# Patient Record
Sex: Female | Born: 1985 | Hispanic: Yes | Marital: Married | Smoking: Never smoker
Health system: Southern US, Community
[De-identification: ages and names within clinical notes are randomized; demographics above are authoritative.]

## PROBLEM LIST (undated history)

## (undated) DIAGNOSIS — E282 Polycystic ovarian syndrome: Secondary | ICD-10-CM

## (undated) DIAGNOSIS — M5030 Other cervical disc degeneration, unspecified cervical region: Secondary | ICD-10-CM

## (undated) HISTORY — DX: Polycystic ovarian syndrome: E28.2

## (undated) HISTORY — PX: NO PAST SURGERIES: SHX2092

## (undated) HISTORY — DX: Other cervical disc degeneration, unspecified cervical region: M50.30

---

## 2015-11-12 DIAGNOSIS — L700 Acne vulgaris: Secondary | ICD-10-CM | POA: Diagnosis not present

## 2015-11-12 DIAGNOSIS — L853 Xerosis cutis: Secondary | ICD-10-CM | POA: Diagnosis not present

## 2015-11-12 DIAGNOSIS — L309 Dermatitis, unspecified: Secondary | ICD-10-CM | POA: Diagnosis not present

## 2015-12-09 DIAGNOSIS — H5213 Myopia, bilateral: Secondary | ICD-10-CM | POA: Diagnosis not present

## 2016-02-11 DIAGNOSIS — L700 Acne vulgaris: Secondary | ICD-10-CM | POA: Diagnosis not present

## 2016-02-11 DIAGNOSIS — L810 Postinflammatory hyperpigmentation: Secondary | ICD-10-CM | POA: Diagnosis not present

## 2016-08-20 ENCOUNTER — Encounter: Payer: Self-pay | Admitting: Obstetrics and Gynecology

## 2016-08-20 ENCOUNTER — Ambulatory Visit (INDEPENDENT_AMBULATORY_CARE_PROVIDER_SITE_OTHER): Payer: 59 | Admitting: Obstetrics and Gynecology

## 2016-08-20 VITALS — BP 103/68 | HR 74 | Ht 62.0 in | Wt 142.2 lb

## 2016-08-20 DIAGNOSIS — Z7689 Persons encountering health services in other specified circumstances: Secondary | ICD-10-CM | POA: Diagnosis not present

## 2016-08-20 DIAGNOSIS — Z01419 Encounter for gynecological examination (general) (routine) without abnormal findings: Secondary | ICD-10-CM | POA: Diagnosis not present

## 2016-08-20 DIAGNOSIS — E663 Overweight: Secondary | ICD-10-CM

## 2016-08-20 NOTE — Progress Notes (Signed)
GYNECOLOGY ANNUAL PHYSICAL EXAM PROGRESS NOTE  Subjective:    Melinda Wells is a 31 y.o. G53P0010 female who presents to establish care, and for an annual exam. The patient has relocated from [place] 2 years ago. Has not seen a provider since then. The patient has no complaints today. The patient is sexually active.  The patient wears seatbelts: yes. The patient participates in regular exercise: yes (3 x weekly). The patient reports that there is not domestic violence in her life.    Gynecologic History Menarche age: 38 Patient's last menstrual period was 08/06/2016. Period Cycle (Days): 28 Period Duration (Days): 4-5 Period Pattern: Regular Menstrual Flow: Light Dysmenorrhea: None  Contraception: OCP (estrogen/progesterone) History of STI's: Chlamydia (2-3 years ago).  Last Pap: 2 years ago (performed in [place]). Results were: normal.  Denies h/o abnormal pap smears.   Obstetric History   G1   P0   T0   P0   A1   L0    SAB0   TAB0   Ectopic0   Multiple0   Live Births0     # Outcome Date GA Lbr Len/2nd Weight Sex Delivery Anes PTL Lv  1 AB 2012              Past Medical History:  Diagnosis Date  . PCOS (polycystic ovarian syndrome) 2010    History reviewed. No pertinent surgical history.  Family History  Problem Relation Age of Onset  . Hyperlipidemia Mother     Social History   Social History  . Marital status: Married    Spouse name: N/A  . Number of children: N/A  . Years of education: N/A   Occupational History  . Not on file.   Social History Main Topics  . Smoking status: Never Smoker  . Smokeless tobacco: Never Used  . Alcohol use Yes     Comment: Occasionally   . Drug use: No  . Sexual activity: Yes    Birth control/ protection: Pill   Other Topics Concern  . Not on file   Social History Narrative  . No narrative on file    No current outpatient prescriptions on file prior to visit.   No current facility-administered medications on  file prior to visit.     No Known Allergies   Review of Systems Constitutional: negative for chills, fatigue, fevers and sweats Eyes: negative for irritation, redness and visual disturbance Ears, nose, mouth, throat, and face: negative for hearing loss, nasal congestion, snoring and tinnitus Respiratory: negative for asthma, cough, sputum Cardiovascular: negative for chest pain, dyspnea, exertional chest pressure/discomfort, irregular heart beat, palpitations and syncope Gastrointestinal: negative for abdominal pain, change in bowel habits, nausea and vomiting Genitourinary: negative for abnormal menstrual periods, genital lesions, sexual problems and vaginal discharge, dysuria and urinary incontinence Integument/breast: negative for breast lump, breast tenderness and nipple discharge Hematologic/lymphatic: negative for bleeding and easy bruising Musculoskeletal:negative for back pain and muscle weakness Neurological: negative for dizziness, headaches, vertigo and weakness Endocrine: negative for diabetic symptoms including polydipsia, polyuria and skin dryness Allergic/Immunologic: negative for hay fever and urticaria        Objective:  Blood pressure 103/68, pulse 74, height 5\' 2"  (1.575 m), weight 142 lb 3.2 oz (64.5 kg), last menstrual period 08/06/2016. Body mass index is 26.01 kg/m.  General Appearance:    Alert, cooperative, no distress, appears stated age, overweight  Head:    Normocephalic, without obvious abnormality, atraumatic  Eyes:    PERRL, conjunctiva/corneas clear, EOM's intact,  both eyes  Ears:    Normal external ear canals, both ears  Nose:   Nares normal, septum midline, mucosa normal, no drainage or sinus tenderness  Throat:   Lips, mucosa, and tongue normal; teeth and gums normal  Neck:   Supple, symmetrical, trachea midline, no adenopathy; thyroid: no enlargement/tenderness/nodules; no carotid bruit or JVD  Back:     Symmetric, no curvature, ROM normal, no CVA  tenderness  Lungs:     Clear to auscultation bilaterally, respirations unlabored  Chest Wall:    No tenderness or deformity   Heart:    Regular rate and rhythm, S1 and S2 normal, no murmur, rub or gallop  Breast Exam:    No tenderness, masses, or nipple abnormality  Abdomen:     Soft, non-tender, bowel sounds active all four quadrants, no masses, no organomegaly.    Genitalia:    Pelvic:external genitalia normal, vagina without lesions, discharge, or tenderness, rectovaginal septum  normal. Cervix normal in appearance, no cervical motion tenderness, no adnexal masses or tenderness.  Uterus normal size, shape, mobile, regular contours, nontender.  Rectal:    Normal external sphincter.  No hemorrhoids appreciated. Internal exam not done.   Extremities:   Extremities normal, atraumatic, no cyanosis or edema  Pulses:   2+ and symmetric all extremities  Skin:   Skin color, texture, turgor normal, no rashes or lesions  Lymph nodes:   Cervical, supraclavicular, and axillary nodes normal  Neurologic:   CNII-XII intact, normal strength, sensation and reflexes throughout   .  Labs:  No results found for: WBC, HGB, HCT, MCV, PLT  No results found for: CREATININE, BUN, NA, K, CL, CO2  No results found for: ALT, AST, GGT, ALKPHOS, BILITOT  No results found for: TSH   Assessment:   Encounter for gynecological examination without abnormal finding Encounter to establish care Overweight (BMI 25.0-29.9)  Plan:     Blood tests: CBC with diff and Comprehensive metabolic panel. Breast self exam technique reviewed and patient encouraged to perform self-exam monthly. Contraception: OCP (estrogen/progesterone).  Patient will need a refill in ~ 3-4 months. To call when for refill request closer to time.  Discussed healthy lifestyle modifications. Pap smear up to date. Due in 1 year. Patient has received flu vaccine for this season.   Follow up for annual exam in 1 year.     Hildred LaserAnika Meenakshi Sazama,  MD [facility]

## 2016-08-20 NOTE — Patient Instructions (Addendum)
 Preventive Care 18-39 Years, Female Preventive care refers to lifestyle choices and visits with your health care provider that can promote health and wellness. What does preventive care include?  A yearly physical exam. This is also called an annual well check.  Dental exams once or twice a year.  Routine eye exams. Ask your health care provider how often you should have your eyes checked.  Personal lifestyle choices, including:  Daily care of your teeth and gums.  Regular physical activity.  Eating a healthy diet.  Avoiding tobacco and drug use.  Limiting alcohol use.  Practicing safe sex.  Taking vitamin and mineral supplements as recommended by your health care provider. What happens during an annual well check? The services and screenings done by your health care provider during your annual well check will depend on your age, overall health, lifestyle risk factors, and family history of disease. Counseling  Your health care provider may ask you questions about your:  Alcohol use.  Tobacco use.  Drug use.  Emotional well-being.  Home and relationship well-being.  Sexual activity.  Eating habits.  Work and work environment.  Method of birth control.  Menstrual cycle.  Pregnancy history. Screening  You may have the following tests or measurements:  Height, weight, and BMI.  Diabetes screening. This is done by checking your blood sugar (glucose) after you have not eaten for a while (fasting).  Blood pressure.  Lipid and cholesterol levels. These may be checked every 5 years starting at age 20.  Skin check.  Hepatitis C blood test.  Hepatitis B blood test.  Sexually transmitted disease (STD) testing.  BRCA-related cancer screening. This may be done if you have a family history of breast, ovarian, tubal, or peritoneal cancers.  Pelvic exam and Pap test. This may be done every 3 years starting at age 21. Starting at age 30, this may be done  every 5 years if you have a Pap test in combination with an HPV test. Discuss your test results, treatment options, and if necessary, the need for more tests with your health care provider. Vaccines  Your health care provider may recommend certain vaccines, such as:  Influenza vaccine. This is recommended every year.  Tetanus, diphtheria, and acellular pertussis (Tdap, Td) vaccine. You may need a Td booster every 10 years.  Varicella vaccine. You may need this if you have not been vaccinated.  HPV vaccine. If you are 26 or younger, you may need three doses over 6 months.  Measles, mumps, and rubella (MMR) vaccine. You may need at least one dose of MMR. You may also need a second dose.  Pneumococcal 13-valent conjugate (PCV13) vaccine. You may need this if you have certain conditions and were not previously vaccinated.  Pneumococcal polysaccharide (PPSV23) vaccine. You may need one or two doses if you smoke cigarettes or if you have certain conditions.  Meningococcal vaccine. One dose is recommended if you are age 19-21 years and a first-year college student living in a residence hall, or if you have one of several medical conditions. You may also need additional booster doses.  Hepatitis A vaccine. You may need this if you have certain conditions or if you travel or work in places where you may be exposed to hepatitis A.  Hepatitis B vaccine. You may need this if you have certain conditions or if you travel or work in places where you may be exposed to hepatitis B.  Haemophilus influenzae type b (Hib) vaccine. You may need   this if you have certain risk factors. Talk to your health care provider about which screenings and vaccines you need and how often you need them. This information is not intended to replace advice given to you by your health care provider. Make sure you discuss any questions you have with your health care provider. Document Released: 09/01/2001 Document Revised:  03/25/2016 Document Reviewed: 05/07/2015 Elsevier Interactive Patient Education  2017 Elsevier Inc.  

## 2016-08-21 ENCOUNTER — Telehealth: Payer: Self-pay

## 2016-08-21 LAB — COMPREHENSIVE METABOLIC PANEL
A/G RATIO: 1.5 (ref 1.2–2.2)
ALBUMIN: 4.2 g/dL (ref 3.5–5.5)
ALK PHOS: 65 IU/L (ref 39–117)
ALT: 8 IU/L (ref 0–32)
AST: 12 IU/L (ref 0–40)
BUN / CREAT RATIO: 16 (ref 9–23)
BUN: 10 mg/dL (ref 6–20)
Bilirubin Total: <0.2 mg/dL (ref 0.0–1.2)
CHLORIDE: 99 mmol/L (ref 96–106)
CO2: 24 mmol/L (ref 18–29)
Calcium: 9.4 mg/dL (ref 8.7–10.2)
Creatinine, Ser: 0.64 mg/dL (ref 0.57–1.00)
GFR calc Af Amer: 139 mL/min/{1.73_m2} (ref >59)
GFR calc non Af Amer: 121 mL/min/{1.73_m2} (ref >59)
GLOBULIN, TOTAL: 2.8 g/dL (ref 1.5–4.5)
Glucose: 99 mg/dL (ref 65–99)
POTASSIUM: 4.1 mmol/L (ref 3.5–5.2)
SODIUM: 136 mmol/L (ref 134–144)
Total Protein: 7 g/dL (ref 6.0–8.5)

## 2016-08-21 LAB — CBC
HEMATOCRIT: 40 % (ref 34.0–46.6)
HEMOGLOBIN: 13.1 g/dL (ref 11.1–15.9)
MCH: 27.9 pg (ref 26.6–33.0)
MCHC: 32.8 g/dL (ref 31.5–35.7)
MCV: 85 fL (ref 79–97)
Platelets: 307 10*3/uL (ref 150–379)
RBC: 4.7 x10E6/uL (ref 3.77–5.28)
RDW: 13.3 % (ref 12.3–15.4)
WBC: 10.6 10*3/uL (ref 3.4–10.8)

## 2016-08-21 NOTE — Telephone Encounter (Signed)
Called pt informed her of normal labs. PT gave verbal understanding. Sent text for mychart sign up.

## 2016-08-21 NOTE — Telephone Encounter (Signed)
-----   Message from Hildred LaserAnika Cherry, MD sent at 08/21/2016 11:48 AM EST ----- Please inform of normal annual screening labs

## 2016-09-07 DIAGNOSIS — L700 Acne vulgaris: Secondary | ICD-10-CM | POA: Diagnosis not present

## 2016-09-29 DIAGNOSIS — Z5181 Encounter for therapeutic drug level monitoring: Secondary | ICD-10-CM | POA: Diagnosis not present

## 2016-09-29 DIAGNOSIS — Z3202 Encounter for pregnancy test, result negative: Secondary | ICD-10-CM | POA: Diagnosis not present

## 2016-09-29 DIAGNOSIS — L700 Acne vulgaris: Secondary | ICD-10-CM | POA: Diagnosis not present

## 2016-10-30 DIAGNOSIS — Z3202 Encounter for pregnancy test, result negative: Secondary | ICD-10-CM | POA: Diagnosis not present

## 2016-10-30 DIAGNOSIS — L700 Acne vulgaris: Secondary | ICD-10-CM | POA: Diagnosis not present

## 2016-11-27 DIAGNOSIS — Z5181 Encounter for therapeutic drug level monitoring: Secondary | ICD-10-CM | POA: Diagnosis not present

## 2016-11-27 DIAGNOSIS — L700 Acne vulgaris: Secondary | ICD-10-CM | POA: Diagnosis not present

## 2016-11-30 DIAGNOSIS — L700 Acne vulgaris: Secondary | ICD-10-CM | POA: Diagnosis not present

## 2016-11-30 DIAGNOSIS — Z3202 Encounter for pregnancy test, result negative: Secondary | ICD-10-CM | POA: Diagnosis not present

## 2017-01-04 DIAGNOSIS — Z3202 Encounter for pregnancy test, result negative: Secondary | ICD-10-CM | POA: Diagnosis not present

## 2017-01-04 DIAGNOSIS — L700 Acne vulgaris: Secondary | ICD-10-CM | POA: Diagnosis not present

## 2017-01-12 DIAGNOSIS — G8929 Other chronic pain: Secondary | ICD-10-CM | POA: Diagnosis not present

## 2017-01-12 DIAGNOSIS — R7989 Other specified abnormal findings of blood chemistry: Secondary | ICD-10-CM | POA: Diagnosis not present

## 2017-01-12 DIAGNOSIS — L709 Acne, unspecified: Secondary | ICD-10-CM | POA: Diagnosis not present

## 2017-01-12 DIAGNOSIS — Z7689 Persons encountering health services in other specified circumstances: Secondary | ICD-10-CM | POA: Diagnosis not present

## 2017-01-12 DIAGNOSIS — M545 Low back pain: Secondary | ICD-10-CM | POA: Diagnosis not present

## 2017-01-12 DIAGNOSIS — R5383 Other fatigue: Secondary | ICD-10-CM | POA: Diagnosis not present

## 2017-01-14 DIAGNOSIS — Z3202 Encounter for pregnancy test, result negative: Secondary | ICD-10-CM | POA: Diagnosis not present

## 2017-02-05 ENCOUNTER — Telehealth: Payer: Self-pay | Admitting: Obstetrics and Gynecology

## 2017-02-05 DIAGNOSIS — Z3041 Encounter for surveillance of contraceptive pills: Secondary | ICD-10-CM

## 2017-02-05 MED ORDER — TRI-LINYAH 0.18/0.215/0.25 MG-35 MCG PO TABS: 1.0000 | ORAL_TABLET | Freq: Every day | ORAL | 12 refills | Status: DC

## 2017-02-05 NOTE — Telephone Encounter (Signed)
Called pt no answer. Unable to leave message as no voicemail is set up.  

## 2017-02-05 NOTE — Telephone Encounter (Signed)
Patient called and stated that she needs a refill on the medication (Orthotricyclen) Generic is fine with the patient, Patient stated that Dr. Valentino Saxonherry stated that she would refill the medication even though it was prescribed by another doctor. Patient would like to speak with someone when the medication has been sent in. Please advise.

## 2017-02-15 DIAGNOSIS — L700 Acne vulgaris: Secondary | ICD-10-CM | POA: Diagnosis not present

## 2017-02-15 DIAGNOSIS — Z3202 Encounter for pregnancy test, result negative: Secondary | ICD-10-CM | POA: Diagnosis not present

## 2017-03-18 DIAGNOSIS — L700 Acne vulgaris: Secondary | ICD-10-CM | POA: Diagnosis not present

## 2017-03-18 DIAGNOSIS — Z3202 Encounter for pregnancy test, result negative: Secondary | ICD-10-CM | POA: Diagnosis not present

## 2017-04-19 DIAGNOSIS — L700 Acne vulgaris: Secondary | ICD-10-CM | POA: Diagnosis not present

## 2017-04-19 DIAGNOSIS — Z3202 Encounter for pregnancy test, result negative: Secondary | ICD-10-CM | POA: Diagnosis not present

## 2017-04-23 DIAGNOSIS — E538 Deficiency of other specified B group vitamins: Secondary | ICD-10-CM | POA: Diagnosis not present

## 2017-04-23 DIAGNOSIS — R7989 Other specified abnormal findings of blood chemistry: Secondary | ICD-10-CM | POA: Diagnosis not present

## 2017-04-23 DIAGNOSIS — D72829 Elevated white blood cell count, unspecified: Secondary | ICD-10-CM | POA: Diagnosis not present

## 2017-04-23 DIAGNOSIS — M545 Low back pain: Secondary | ICD-10-CM | POA: Diagnosis not present

## 2017-04-23 DIAGNOSIS — L709 Acne, unspecified: Secondary | ICD-10-CM | POA: Diagnosis not present

## 2017-04-27 DIAGNOSIS — Z3202 Encounter for pregnancy test, result negative: Secondary | ICD-10-CM | POA: Diagnosis not present

## 2017-05-08 DIAGNOSIS — D72829 Elevated white blood cell count, unspecified: Secondary | ICD-10-CM | POA: Insufficient documentation

## 2017-05-08 NOTE — Progress Notes (Signed)
[place]  Telephone:([phone]) [phone] Fax:(336) [phone]  ID: Melinda Wells OB: [birth year]  MR#: [id number]  MGN#:[id number]  Patient Care Team: Tracie Harrier, MD as PCP - General (Internal Medicine)  CHIEF COMPLAINT: Leukocytosis.  INTERVAL HISTORY: patient is a 31 year old female who was noted to have an elevated white blood cell count on routine blood work. Repeat laboratory work approximately 3 months later revealed a persistent elevation. She currently feels well and is asymptomatic.She has no neurologic complaints. She denies any new medications. She has a good appetite and denies weight loss. She denies any recent fevers or illnesses. He has no chest pain or shortness of breath. She denies any nausea, vomiting, constipation, or diarrhea. She has no urinary complaints. Patient feels at her baseline and offers no specific complaints today.  REVIEW OF SYSTEMS:   Review of Systems  Constitutional: Negative.  Negative for fever, malaise/fatigue and weight loss.  Respiratory: Negative.  Negative for cough and shortness of breath.   Cardiovascular: Negative.  Negative for chest pain and leg swelling.  Gastrointestinal: Negative.  Negative for abdominal pain, blood in stool and melena.  Genitourinary: Negative.  Negative for dysuria, frequency and urgency.  Musculoskeletal: Negative.   Skin: Negative.  Negative for rash.  Neurological: Negative.  Negative for weakness.  Psychiatric/Behavioral: The patient is nervous/anxious.     As per HPI. Otherwise, a complete review of systems is negative.  PAST MEDICAL HISTORY: Past Medical History:  Diagnosis Date  . PCOS (polycystic ovarian syndrome)     PAST SURGICAL HISTORY: No past surgical history on file.  FAMILY HISTORY: Family History  Problem Relation Age of Onset  . Hyperlipidemia Mother     ADVANCED DIRECTIVES (Y/N):  N  HEALTH MAINTENANCE: Social History  Substance Use Topics  . Smoking status:  Never Smoker  . Smokeless tobacco: Never Used  . Alcohol use Yes     Comment: Occasionally      Colonoscopy:  PAP:  Bone density:  Lipid panel:  No Known Allergies  Current Outpatient Prescriptions  Medication Sig Dispense Refill  . docusate sodium (COLACE) 100 MG capsule Take 100 mg by mouth 2 (two) times daily.    . ISOtretinoin (AMNESTEEM) 40 MG capsule Take by mouth.    . meloxicam (MOBIC) 7.5 MG tablet Take by mouth.    . Norgestimate-Ethinyl Estradiol Triphasic 0.18/0.215/0.25 MG-35 MCG tablet Take by mouth.    . Omega-3 Fatty Acids (FISH OIL) 1000 MG CAPS Take by mouth.    . Probiotic Product (SOLUBLE FIBER/PROBIOTICS PO) Take by mouth.    . vitamin B-12 (CYANOCOBALAMIN) 1000 MCG tablet Take 1,000 mcg by mouth daily.    . Vitamin D, Ergocalciferol, (DRISDOL) 50000 units CAPS capsule   5   No current facility-administered medications for this visit.     OBJECTIVE: Vitals:   05/10/17 1027  BP: 105/74  Pulse: 87  Resp: 18  Temp: 97.6 F (36.4 C)     There is no height or weight on file to calculate BMI.    ECOG FS:0 - Asymptomatic  General: Well-developed, well-nourished, no acute distress. Eyes: Pink conjunctiva, anicteric sclera. HEENT: Normocephalic, moist mucous membranes, clear oropharnyx. Lungs: Clear to auscultation bilaterally. Heart: Regular rate and rhythm. No rubs, murmurs, or gallops. Abdomen: Soft, nontender, nondistended. No organomegaly noted, normoactive bowel sounds. Musculoskeletal: No edema, cyanosis, or clubbing. Neuro: Alert, answering all questions appropriately. Cranial nerves grossly intact. Skin: No rashes or petechiae noted. Psych: Normal affect. Lymphatics: No cervical, calvicular, axillary or inguinal  LAD.   LAB RESULTS:  Lab Results  Component Value Date   NA 136 08/20/2016   K 4.1 08/20/2016   CL 99 08/20/2016   CO2 24 08/20/2016   GLUCOSE 99 08/20/2016   BUN 10 08/20/2016   CREATININE 0.64 08/20/2016   CALCIUM 9.4  08/20/2016   PROT 7.0 08/20/2016   ALBUMIN 4.2 08/20/2016   AST 12 08/20/2016   ALT 8 08/20/2016   ALKPHOS 65 08/20/2016   BILITOT <0.2 08/20/2016   GFRNONAA 121 08/20/2016   GFRAA 139 08/20/2016    Lab Results  Component Value Date   WBC 8.8 05/10/2017   NEUTROABS 5.5 05/10/2017   HGB 13.5 05/10/2017   HCT 40.8 05/10/2017   MCV 86.3 05/10/2017   PLT 347 05/10/2017     STUDIES: No results found.  ASSESSMENT: Leukocytosis  PLAN:    1. Leukocytosis:although previously elevated, patient's white blood cell count is now within normal limits today. For completeness, a peripheral blood flow cytometry as well as BCR-ABL have been ordered and are pending at time of dictation. The remainder of her laboratory work from today is also pending at time of dictation. No intervention is needed. Patient does not require bone marrow biopsy. Return to clinic in 1 month with repeat laboratory work and further evaluation. If her white blood cell count remains within normal limits and the remainder of her laboratory work is negative, she likely can be discharged from clinic.  Approximately 45 minutes was spent in discussion of which greater than 50% was consultation.  Patient expressed understanding and was in agreement with this plan. She also understands that She can call clinic at any time with any questions, concerns, or complaints.    Lloyd Huger, MD   05/10/2017 12:02 PM

## 2017-05-10 ENCOUNTER — Inpatient Hospital Stay: Payer: 59

## 2017-05-10 ENCOUNTER — Inpatient Hospital Stay: Payer: 59 | Attending: Oncology | Admitting: Oncology

## 2017-05-10 VITALS — BP 105/74 | HR 87 | Temp 97.6°F | Resp 18

## 2017-05-10 DIAGNOSIS — D72829 Elevated white blood cell count, unspecified: Secondary | ICD-10-CM

## 2017-05-10 DIAGNOSIS — Z79899 Other long term (current) drug therapy: Secondary | ICD-10-CM | POA: Diagnosis not present

## 2017-05-10 DIAGNOSIS — E282 Polycystic ovarian syndrome: Secondary | ICD-10-CM | POA: Diagnosis not present

## 2017-05-10 LAB — IRON AND TIBC
Iron: 85 ug/dL (ref 28–170)
Saturation Ratios: 19 % (ref 10.4–31.8)
TIBC: 452 ug/dL — ABNORMAL HIGH (ref 250–450)
UIBC: 367 ug/dL

## 2017-05-10 LAB — CBC WITH DIFFERENTIAL/PLATELET
BASOS PCT: 1 %
Basophils Absolute: 0.1 10*3/uL (ref 0–0.1)
EOS ABS: 0.1 10*3/uL (ref 0–0.7)
Eosinophils Relative: 1 %
HEMATOCRIT: 40.8 % (ref 35.0–47.0)
Hemoglobin: 13.5 g/dL (ref 12.0–16.0)
Lymphocytes Relative: 30 %
Lymphs Abs: 2.6 10*3/uL (ref 1.0–3.6)
MCH: 28.6 pg (ref 26.0–34.0)
MCHC: 33.2 g/dL (ref 32.0–36.0)
MCV: 86.3 fL (ref 80.0–100.0)
MONOS PCT: 5 %
Monocytes Absolute: 0.5 10*3/uL (ref 0.2–0.9)
NEUTROS PCT: 63 %
Neutro Abs: 5.5 10*3/uL (ref 1.4–6.5)
PLATELETS: 347 10*3/uL (ref 150–440)
RBC: 4.73 MIL/uL (ref 3.80–5.20)
RDW: 13.1 % (ref 11.5–14.5)
WBC: 8.8 10*3/uL (ref 3.6–11.0)

## 2017-05-10 LAB — COMPREHENSIVE METABOLIC PANEL
ALK PHOS: 96 U/L (ref 38–126)
ALT: 14 U/L (ref 14–54)
AST: 25 U/L (ref 15–41)
Albumin: 4 g/dL (ref 3.5–5.0)
Anion gap: 10 (ref 5–15)
BILIRUBIN TOTAL: 0.5 mg/dL (ref 0.3–1.2)
BUN: 9 mg/dL (ref 6–20)
CALCIUM: 9.4 mg/dL (ref 8.9–10.3)
CHLORIDE: 100 mmol/L — AB (ref 101–111)
CO2: 27 mmol/L (ref 22–32)
CREATININE: 0.62 mg/dL (ref 0.44–1.00)
Glucose, Bld: 108 mg/dL — ABNORMAL HIGH (ref 65–99)
Potassium: 3.4 mmol/L — ABNORMAL LOW (ref 3.5–5.1)
Sodium: 137 mmol/L (ref 135–145)
TOTAL PROTEIN: 8.2 g/dL — AB (ref 6.5–8.1)

## 2017-05-10 LAB — VITAMIN B12: Vitamin B-12: 235 pg/mL (ref 180–914)

## 2017-05-10 LAB — LACTATE DEHYDROGENASE: LDH: 119 U/L (ref 98–192)

## 2017-05-10 LAB — FERRITIN: FERRITIN: 39 ng/mL (ref 11–307)

## 2017-05-10 LAB — FOLATE: FOLATE: 28 ng/mL (ref >5.9)

## 2017-05-14 LAB — COMP PANEL: LEUKEMIA/LYMPHOMA

## 2017-05-18 LAB — BCR-ABL1, CML/ALL, PCR, QUANT

## 2017-05-28 DIAGNOSIS — Z3202 Encounter for pregnancy test, result negative: Secondary | ICD-10-CM | POA: Diagnosis not present

## 2017-05-28 DIAGNOSIS — L700 Acne vulgaris: Secondary | ICD-10-CM | POA: Diagnosis not present

## 2017-06-05 NOTE — Progress Notes (Signed)
[place]  Telephone:(336) [phone] Fax:(336) [phone]  ID: EULIA HATCHER OB: 12-18-1985  MR#: [id number]  UYQ#:[id number]  Patient Care Team: Tracie Harrier, MD as PCP - General (Internal Medicine)  CHIEF COMPLAINT: Leukocytosis.  INTERVAL HISTORY: Patient returns to clinic today for repeat laboratory work and further evaluation.  She currently feels well and is asymptomatic.She has no neurologic complaints. She denies any new medications. She has a good appetite and denies weight loss. She denies any recent fevers or illnesses. He has no chest pain or shortness of breath. She denies any nausea, vomiting, constipation, or diarrhea. She has no urinary complaints. Patient feels at her baseline and offers no specific complaints today.  REVIEW OF SYSTEMS:   Review of Systems  Constitutional: Negative.  Negative for fever, malaise/fatigue and weight loss.  Respiratory: Negative.  Negative for cough and shortness of breath.   Cardiovascular: Negative.  Negative for chest pain and leg swelling.  Gastrointestinal: Negative.  Negative for abdominal pain, blood in stool and melena.  Genitourinary: Negative.  Negative for dysuria, frequency and urgency.  Musculoskeletal: Negative.   Skin: Negative.  Negative for rash.  Neurological: Negative.  Negative for weakness.  Psychiatric/Behavioral: Negative.  The patient is not nervous/anxious.     As per HPI. Otherwise, a complete review of systems is negative.  PAST MEDICAL HISTORY: Past Medical History:  Diagnosis Date  . PCOS (polycystic ovarian syndrome)     PAST SURGICAL HISTORY: History reviewed. No pertinent surgical history.  FAMILY HISTORY: Family History  Problem Relation Age of Onset  . Hyperlipidemia Mother     ADVANCED DIRECTIVES (Y/N):  N  HEALTH MAINTENANCE: Social History   Tobacco Use  . Smoking status: Never Smoker  . Smokeless tobacco: Never Used  Substance Use Topics  . Alcohol use: Yes   Comment: Occasionally   . Drug use: No     Colonoscopy:  PAP:  Bone density:  Lipid panel:  No Known Allergies  Current Outpatient Medications  Medication Sig Dispense Refill  . docusate sodium (COLACE) 100 MG capsule Take 100 mg by mouth 2 (two) times daily.    . ISOtretinoin (AMNESTEEM) 40 MG capsule Take by mouth.    . meloxicam (MOBIC) 7.5 MG tablet Take by mouth.    . Norgestimate-Ethinyl Estradiol Triphasic 0.18/0.215/0.25 MG-35 MCG tablet Take by mouth.    . Omega-3 Fatty Acids (FISH OIL) 1000 MG CAPS Take by mouth.    . Probiotic Product (SOLUBLE FIBER/PROBIOTICS PO) Take by mouth.    . triamcinolone cream (KENALOG) 0.1 % Apply 1 application 2 (two) times daily topically.    . vitamin B-12 (CYANOCOBALAMIN) 1000 MCG tablet Take 1,000 mcg by mouth daily.    . Vitamin D, Ergocalciferol, (DRISDOL) 50000 units CAPS capsule   5   No current facility-administered medications for this visit.     OBJECTIVE: Vitals:   06/07/17 1456  BP: 105/72  Pulse: 84  Resp: 18  Temp: (!) 97 F (36.1 C)     Body mass index is 27.38 kg/m.    ECOG FS:0 - Asymptomatic  General: Well-developed, well-nourished, no acute distress. Eyes: Pink conjunctiva, anicteric sclera. Lungs: Clear to auscultation bilaterally. Heart: Regular rate and rhythm. No rubs, murmurs, or gallops. Abdomen: Soft, nontender, nondistended. No organomegaly noted, normoactive bowel sounds. Musculoskeletal: No edema, cyanosis, or clubbing. Neuro: Alert, answering all questions appropriately. Cranial nerves grossly intact. Skin: No rashes or petechiae noted. Psych: Normal affect.   LAB RESULTS:  Lab Results  Component Value  Date   NA 137 05/10/2017   K 3.4 (L) 05/10/2017   CL 100 (L) 05/10/2017   CO2 27 05/10/2017   GLUCOSE 108 (H) 05/10/2017   BUN 9 05/10/2017   CREATININE 0.62 05/10/2017   CALCIUM 9.4 05/10/2017   PROT 8.2 (H) 05/10/2017   ALBUMIN 4.0 05/10/2017   AST 25 05/10/2017   ALT 14 05/10/2017    ALKPHOS 96 05/10/2017   BILITOT 0.5 05/10/2017   GFRNONAA >60 05/10/2017   GFRAA >60 05/10/2017    Lab Results  Component Value Date   WBC 11.5 (H) 06/07/2017   NEUTROABS 6.3 06/07/2017   HGB 13.2 06/07/2017   HCT 40.6 06/07/2017   MCV 86.3 06/07/2017   PLT 354 06/07/2017     STUDIES: No results found.  ASSESSMENT: Leukocytosis  PLAN:    1. Leukocytosis: Patient's white blood cell count is only mildly elevated today.  She continues to be asymptomatic.  Peripheral blood flow cytometry and BCR-ABL are negative.  The remainder of her laboratory work including antineutrophil antibodies is also negative.  No intervention is needed at this time.  Patient does not require a bone marrow biopsy.  After lengthy discussion with the patient, it was agreed upon that no further follow-up is necessary.  Please refer patient back if there are any questions or concerns.    Approximately 20 minutes was spent in discussion of which greater than 50% was consultation.  Patient expressed understanding and was in agreement with this plan. She also understands that She can call clinic at any time with any questions, concerns, or complaints.    Lloyd Huger, MD   06/07/2017 4:32 PM

## 2017-06-07 ENCOUNTER — Inpatient Hospital Stay: Payer: 59 | Attending: Oncology

## 2017-06-07 ENCOUNTER — Encounter: Payer: Self-pay | Admitting: Oncology

## 2017-06-07 ENCOUNTER — Inpatient Hospital Stay (HOSPITAL_BASED_OUTPATIENT_CLINIC_OR_DEPARTMENT_OTHER): Payer: 59 | Admitting: Oncology

## 2017-06-07 ENCOUNTER — Other Ambulatory Visit: Payer: Self-pay

## 2017-06-07 VITALS — BP 105/72 | HR 84 | Temp 97.0°F | Resp 18 | Wt 149.7 lb

## 2017-06-07 DIAGNOSIS — Z79899 Other long term (current) drug therapy: Secondary | ICD-10-CM | POA: Diagnosis not present

## 2017-06-07 DIAGNOSIS — E282 Polycystic ovarian syndrome: Secondary | ICD-10-CM | POA: Insufficient documentation

## 2017-06-07 DIAGNOSIS — D72829 Elevated white blood cell count, unspecified: Secondary | ICD-10-CM | POA: Insufficient documentation

## 2017-06-07 LAB — CBC WITH DIFFERENTIAL/PLATELET
Basophils Absolute: 0.1 10*3/uL (ref 0–0.1)
Basophils Relative: 1 %
EOS PCT: 3 %
Eosinophils Absolute: 0.3 10*3/uL (ref 0–0.7)
HEMATOCRIT: 40.6 % (ref 35.0–47.0)
Hemoglobin: 13.2 g/dL (ref 12.0–16.0)
LYMPHS ABS: 4.2 10*3/uL — AB (ref 1.0–3.6)
LYMPHS PCT: 36 %
MCH: 28.1 pg (ref 26.0–34.0)
MCHC: 32.6 g/dL (ref 32.0–36.0)
MCV: 86.3 fL (ref 80.0–100.0)
Monocytes Absolute: 0.6 10*3/uL (ref 0.2–0.9)
Monocytes Relative: 5 %
NEUTROS ABS: 6.3 10*3/uL (ref 1.4–6.5)
Neutrophils Relative %: 55 %
PLATELETS: 354 10*3/uL (ref 150–440)
RBC: 4.71 MIL/uL (ref 3.80–5.20)
RDW: 13.1 % (ref 11.5–14.5)
WBC: 11.5 10*3/uL — AB (ref 3.6–11.0)

## 2017-06-07 NOTE — Progress Notes (Signed)
Patient denies any concerns today.  

## 2017-08-12 ENCOUNTER — Ambulatory Visit (INDEPENDENT_AMBULATORY_CARE_PROVIDER_SITE_OTHER): Payer: Self-pay | Admitting: Family Medicine

## 2017-08-12 VITALS — BP 113/55 | HR 80 | Temp 98.1°F | Wt 153.0 lb

## 2017-08-12 DIAGNOSIS — H1031 Unspecified acute conjunctivitis, right eye: Secondary | ICD-10-CM

## 2017-08-12 MED ORDER — ERYTHROMYCIN 5 MG/GM OP OINT: 1.0000 "application " | TOPICAL_OINTMENT | Freq: Every day | OPHTHALMIC | 0 refills | Status: AC

## 2017-08-12 NOTE — Progress Notes (Signed)
Subjective:    Melinda Wells is a 32 y.o. female who presents for evaluation of discharge, erythema, itching and upper eye lid swelling  in both eyes. She has noticed the above symptoms for 2 weeks. Onset was with upper eye lid swelling and is localized to upper and low eye lid. Patient denies foreign body sensation, photophobia, visual field deficit and orbital pain.  There is a history of treatment with Accutane for treatent of acne and was advised medcation predisposes her to eye infections..  Review of Systems Pertinent items noted in HPI and remainder of comprehensive ROS otherwise negative.   Objective:    BP (!) 113/55   Pulse 80   Temp 98.1 F (36.7 C)   Wt 153 lb (69.4 kg)   SpO2 96%   BMI 27.98 kg/m       General: alert, cooperative, appears stated age and no distress  Eyes:  positive findings: eyelids/periorbital: upper and lower lid erythma and edema present. Conjunctival erythema and watery discharge      Assessment:  Acute bacterial conjunctivitis of right eye On-going for 2 weeks. Negative of vision involvement, however she has significant upper and lower lid edema. Treating with a topical, ophthalmic eye antibiotic for now. Patient provided strict return precautions.  Plan:   Meds ordered this encounter  Medications  . erythromycin [facility](ROMYCIN) ophthalmic ointment    Sig: Place 1 application into the right eye at bedtime for 7 days.    Dispense:  7 g    Refill:  0    If you develop orbital pain, decreased vision, worsening eye discharge, or facial swelling go immediately to the ED or follow-up with your PCP.  If symptoms have not improved significantly or remain unchanged within the next 5 days follow-up immediately with your PCP   Godfrey PickKimberly S. Tiburcio PeaHarris, MSN, FNP-C [address] [place], [place] [id number] [phone]

## 2017-08-12 NOTE — Patient Instructions (Signed)
Prescribing erythromycin eye ointment for conjunctivitis of the right eye. If you develop orbital pain, decreased vision, worsening eye discharge, or facial swelling go immediately to the ED or follow-up with your PCP.  If symptoms have not improved significantly or remain unchanged within the next 5 days follow-up immediately with your PCP.    Bacterial Conjunctivitis Bacterial conjunctivitis is an infection of your conjunctiva. This is the clear membrane that covers the white part of your eye and the inner surface of your eyelid. This condition can make your eye:  Red or pink.  Itchy.  This condition is caused by bacteria. This condition spreads very easily from person to person (is contagious) and from one eye to the other eye. Follow these instructions at home: Medicines  Take or apply your antibiotic medicine as told by your doctor. Do not stop taking or applying the antibiotic even if you start to feel better.  Take or apply over-the-counter and prescription medicines only as told by your doctor.  Do not touch your eyelid with the eye drop bottle or the ointment tube. Managing discomfort  Wipe any fluid from your eye with a warm, wet washcloth or a cotton ball.  Place a cool, clean washcloth on your eye. Do this for 10-20 minutes, 3-4 times per day. General instructions  Do not wear contact lenses until the irritation is gone. Wear glasses until your doctor says it is okay to wear contacts.  Do not wear eye makeup until your symptoms are gone. Throw away any old makeup.  Change or wash your pillowcase every day.  Do not share towels or washcloths with anyone.  Wash your hands often with soap and water. Use paper towels to dry your hands.  Do not touch or rub your eyes.  Do not drive or use heavy machinery if your vision is blurry. Contact a doctor if:  You have a fever.  Your symptoms do not get better after 10 days. Get help right away if:  You have a fever and  your symptoms suddenly get worse.  You have very bad pain when you move your eye.  Your face: ? Hurts. ? Is red. ? Is swollen.  You have sudden loss of vision. This information is not intended to replace advice given to you by your health care provider. Make sure you discuss any questions you have with your health care provider. Document Released: 04/14/2008 Document Revised: 12/12/2015 Document Reviewed: 04/18/2015 [employer] Interactive Patient Education  [employer]2018 [employer]

## 2017-08-26 ENCOUNTER — Encounter: Payer: 59 | Admitting: Obstetrics and Gynecology

## 2017-09-22 ENCOUNTER — Ambulatory Visit (INDEPENDENT_AMBULATORY_CARE_PROVIDER_SITE_OTHER): Payer: No Typology Code available for payment source | Admitting: Obstetrics and Gynecology

## 2017-09-22 ENCOUNTER — Encounter: Payer: Self-pay | Admitting: Obstetrics and Gynecology

## 2017-09-22 VITALS — BP 99/70 | HR 87 | Ht 62.0 in | Wt 154.9 lb

## 2017-09-22 DIAGNOSIS — Z124 Encounter for screening for malignant neoplasm of cervix: Secondary | ICD-10-CM | POA: Diagnosis not present

## 2017-09-22 DIAGNOSIS — Z01419 Encounter for gynecological examination (general) (routine) without abnormal findings: Secondary | ICD-10-CM

## 2017-09-22 DIAGNOSIS — E663 Overweight: Secondary | ICD-10-CM | POA: Diagnosis not present

## 2017-09-22 NOTE — Patient Instructions (Addendum)
Health Maintenance, Female Adopting a healthy lifestyle and getting preventive care can go a long way to promote health and wellness. Talk with your health care provider about what schedule of regular examinations is right for you. This is a good chance for you to check in with your provider about disease prevention and staying healthy. In between checkups, there are plenty of things you can do on your own. Experts have done a lot of research about which lifestyle changes and preventive measures are most likely to keep you healthy. Ask your health care provider for more information. Weight and diet Eat a healthy diet  Be sure to include plenty of vegetables, fruits, low-fat dairy products, and lean protein.  Do not eat a lot of foods high in solid fats, added sugars, or salt.  Get regular exercise. This is one of the most important things you can do for your health. ? Most adults should exercise for at least 150 minutes each week. The exercise should increase your heart rate and make you sweat (moderate-intensity exercise). ? Most adults should also do strengthening exercises at least twice a week. This is in addition to the moderate-intensity exercise.  Maintain a healthy weight  Body mass index (BMI) is a measurement that can be used to identify possible weight problems. It estimates body fat based on height and weight. Your health care provider can help determine your BMI and help you achieve or maintain a healthy weight.  For females 20 years of age and older: ? A BMI below 18.5 is considered underweight. ? A BMI of 18.5 to 24.9 is normal. ? A BMI of 25 to 29.9 is considered overweight. ? A BMI of 30 and above is considered obese.  Watch levels of cholesterol and blood lipids  You should start having your blood tested for lipids and cholesterol at 31 years of age, then have this test every 5 years.  You may need to have your cholesterol levels checked more often if: ? Your lipid or  cholesterol levels are high. ? You are older than 31 years of age. ? You are at high risk for heart disease.  Cancer screening Lung Cancer  Lung cancer screening is recommended for adults 55-80 years old who are at high risk for lung cancer because of a history of smoking.  A yearly low-dose CT scan of the lungs is recommended for people who: ? Currently smoke. ? Have quit within the past 15 years. ? Have at least a 30-pack-year history of smoking. A pack year is smoking an average of one pack of cigarettes a day for 1 year.  Yearly screening should continue until it has been 15 years since you quit.  Yearly screening should stop if you develop a health problem that would prevent you from having lung cancer treatment.  Breast Cancer  Practice breast self-awareness. This means understanding how your breasts normally appear and feel.  It also means doing regular breast self-exams. Let your health care provider know about any changes, no matter how small.  If you are in your 20s or 30s, you should have a clinical breast exam (CBE) by a health care provider every 1-3 years as part of a regular health exam.  If you are 40 or older, have a CBE every year. Also consider having a breast X-ray (mammogram) every year.  If you have a family history of breast cancer, talk to your health care provider about genetic screening.  If you are at high risk   for breast cancer, talk to your health care provider about having an MRI and a mammogram every year.  Breast cancer gene (BRCA) assessment is recommended for women who have family members with BRCA-related cancers. BRCA-related cancers include: ? Breast. ? Ovarian. ? Tubal. ? Peritoneal cancers.  Results of the assessment will determine the need for genetic counseling and BRCA1 and BRCA2 testing.  Cervical Cancer Your health care provider may recommend that you be screened regularly for cancer of the pelvic organs (ovaries, uterus, and  vagina). This screening involves a pelvic examination, including checking for microscopic changes to the surface of your cervix (Pap test). You may be encouraged to have this screening done every 3 years, beginning at age 22.  For women ages 56-65, health care providers may recommend pelvic exams and Pap testing every 3 years, or they may recommend the Pap and pelvic exam, combined with testing for human papilloma virus (HPV), every 5 years. Some types of HPV increase your risk of cervical cancer. Testing for HPV may also be done on women of any age with unclear Pap test results.  Other health care providers may not recommend any screening for nonpregnant women who are considered low risk for pelvic cancer and who do not have symptoms. Ask your health care provider if a screening pelvic exam is right for you.  If you have had past treatment for cervical cancer or a condition that could lead to cancer, you need Pap tests and screening for cancer for at least 20 years after your treatment. If Pap tests have been discontinued, your risk factors (such as having a new sexual partner) need to be reassessed to determine if screening should resume. Some women have medical problems that increase the chance of getting cervical cancer. In these cases, your health care provider may recommend more frequent screening and Pap tests.  Colorectal Cancer  This type of cancer can be detected and often prevented.  Routine colorectal cancer screening usually begins at 31 years of age and continues through 31 years of age.  Your health care provider may recommend screening at an earlier age if you have risk factors for colon cancer.  Your health care provider may also recommend using home test kits to check for hidden blood in the stool.  A small camera at the end of a tube can be used to examine your colon directly (sigmoidoscopy or colonoscopy). This is done to check for the earliest forms of colorectal  cancer.  Routine screening usually begins at age 33.  Direct examination of the colon should be repeated every 5-10 years through 31 years of age. However, you may need to be screened more often if early forms of precancerous polyps or small growths are found.  Skin Cancer  Check your skin from head to toe regularly.  Tell your health care provider about any new moles or changes in moles, especially if there is a change in a mole's shape or color.  Also tell your health care provider if you have a mole that is larger than the size of a pencil eraser.  Always use sunscreen. Apply sunscreen liberally and repeatedly throughout the day.  Protect yourself by wearing long sleeves, pants, a wide-brimmed hat, and sunglasses whenever you are outside.  Heart disease, diabetes, and high blood pressure  High blood pressure causes heart disease and increases the risk of stroke. High blood pressure is more likely to develop in: ? People who have blood pressure in the high end of  the normal range (130-139/85-89 mm Hg). ? People who are overweight or obese. ? People who are African American.  If you are 21-29 years of age, have your blood pressure checked every 3-5 years. If you are 3 years of age or older, have your blood pressure checked every year. You should have your blood pressure measured twice-once when you are at a hospital or clinic, and once when you are not at a hospital or clinic. Record the average of the two measurements. To check your blood pressure when you are not at a hospital or clinic, you can use: ? An automated blood pressure machine at a pharmacy. ? A home blood pressure monitor.  If you are between 17 years and 37 years old, ask your health care provider if you should take aspirin to prevent strokes.  Have regular diabetes screenings. This involves taking a blood sample to check your fasting blood sugar level. ? If you are at a normal weight and have a low risk for diabetes,  have this test once every three years after 31 years of age. ? If you are overweight and have a high risk for diabetes, consider being tested at a younger age or more often. Preventing infection Hepatitis B  If you have a higher risk for hepatitis B, you should be screened for this virus. You are considered at high risk for hepatitis B if: ? You were born in a country where hepatitis B is common. Ask your health care provider which countries are considered high risk. ? Your parents were born in a high-risk country, and you have not been immunized against hepatitis B (hepatitis B vaccine). ? You have HIV or AIDS. ? You use needles to inject street drugs. ? You live with someone who has hepatitis B. ? You have had sex with someone who has hepatitis B. ? You get hemodialysis treatment. ? You take certain medicines for conditions, including cancer, organ transplantation, and autoimmune conditions.  Hepatitis C  Blood testing is recommended for: ? Everyone born from [birth year] through 1964. ? Anyone with known risk factors for hepatitis C.  Sexually transmitted infections (STIs)  You should be screened for sexually transmitted infections (STIs) including gonorrhea and chlamydia if: ? You are sexually active and are younger than 31 years of age. ? You are older than 31 years of age and your health care provider tells you that you are at risk for this type of infection. ? Your sexual activity has changed since you were last screened and you are at an increased risk for chlamydia or gonorrhea. Ask your health care provider if you are at risk.  If you do not have HIV, but are at risk, it may be recommended that you take a prescription medicine daily to prevent HIV infection. This is called pre-exposure prophylaxis (PrEP). You are considered at risk if: ? You are sexually active and do not regularly use condoms or know the HIV status of your partner(s). ? You take drugs by injection. ? You are  sexually active with a partner who has HIV.  Talk with your health care provider about whether you are at high risk of being infected with HIV. If you choose to begin PrEP, you should first be tested for HIV. You should then be tested every 3 months for as long as you are taking PrEP. Pregnancy  If you are premenopausal and you may become pregnant, ask your health care provider about preconception counseling.  If you may become  pregnant, take 400 to 800 micrograms (mcg) of folic acid every day.  If you want to prevent pregnancy, talk to your health care provider about birth control (contraception). Osteoporosis and menopause  Osteoporosis is a disease in which the bones lose minerals and strength with aging. This can result in serious bone fractures. Your risk for osteoporosis can be identified using a bone density scan.  If you are 23 years of age or older, or if you are at risk for osteoporosis and fractures, ask your health care provider if you should be screened.  Ask your health care provider whether you should take a calcium or vitamin D supplement to lower your risk for osteoporosis.  Menopause may have certain physical symptoms and risks.  Hormone replacement therapy may reduce some of these symptoms and risks. Talk to your health care provider about whether hormone replacement therapy is right for you. Follow these instructions at home:  Schedule regular health, dental, and eye exams.  Stay current with your immunizations.  Do not use any tobacco products including cigarettes, chewing tobacco, or electronic cigarettes.  If you are pregnant, do not drink alcohol.  If you are breastfeeding, limit how much and how often you drink alcohol.  Limit alcohol intake to no more than 1 drink per day for nonpregnant women. One drink equals 12 ounces of beer, 5 ounces of wine, or 1 ounces of hard liquor.  Do not use street drugs.  Do not share needles.  Ask your health care  provider for help if you need support or information about quitting drugs.  Tell your health care provider if you often feel depressed.  Tell your health care provider if you have ever been abused or do not feel safe at home. This information is not intended to replace advice given to you by your health care provider. Make sure you discuss any questions you have with your health care provider. Document Released: 01/19/2011 Document Revised: 12/12/2015 Document Reviewed: 04/09/2015 Elsevier Interactive Patient Education  2018 [employer].     HPV (Human Papillomavirus) Vaccine: What You Need to Know 1. Why get vaccinated? HPV vaccine prevents infection with human papillomavirus (HPV) types that are associated with many cancers, including: cervical cancer in females, vaginal and vulvar cancers in females, anal cancer in females and males, throat cancer in females and males, and penile cancer in males.  In addition, HPV vaccine prevents infection with HPV types that cause genital warts in both females and males. In the [place] about 12,000 women get cervical cancer every year, and about 4,000 women die from it. HPV vaccine can prevent most of these cases of cervical cancer. Vaccination is not a substitute for cervical cancer screening. This vaccine does not protect against all HPV types that can cause cervical cancer. Women should still get regular Pap tests. HPV infection usually comes from sexual contact, and most people will become infected at some point in their life. About 14 million Americans, including teens, get infected every year. Most infections will go away on their own and not cause serious problems. But thousands of women and men get cancer and other diseases from HPV. 2. HPV vaccine HPV vaccine is approved by FDA and is recommended by CDC for both males and females. It is routinely given at 44 or 32 years of age, but it may be given beginning at age 34 years through age 69  years. Most adolescents 9 through 31 years of age should get HPV vaccine as a two-dose  series with the doses separated by 6-12 months. People who start HPV vaccination at 23 years of age and older should get the vaccine as a three-dose series with the second dose given 1-2 months after the first dose and the third dose given 6 months after the first dose. There are several exceptions to these age recommendations. Your health care provider can give you more information. 3. Some people should not get this vaccine Anyone who has had a severe (life-threatening) allergic reaction to a dose of HPV vaccine should not get another dose. Anyone who has a severe (life threatening) allergy to any component of HPV vaccine should not get the vaccine. Tell your doctor if you have any severe allergies that you know of, including a severe allergy to yeast. HPV vaccine is not recommended for pregnant women. If you learn that you were pregnant when you were vaccinated, there is no reason to expect any problems for you or your baby. Any woman who learns she was pregnant when she got HPV vaccine is encouraged to contact the manufacturer's registry for HPV vaccination during pregnancy at (862)242-3511. Women who are breastfeeding may be vaccinated. If you have a mild illness, such as a cold, you can probably get the vaccine today. If you are moderately or severely ill, you should probably wait until you recover. Your doctor can advise you. 4. Risks of a vaccine reaction With any medicine, including vaccines, there is a chance of side effects. These are usually mild and go away on their own, but serious reactions are also possible. Most people who get HPV vaccine do not have any serious problems with it. Mild or moderate problems following HPV vaccine: Reactions in the arm where the shot was given: Soreness (about 9 people in 10) Redness or swelling (about 1 person in 3) Fever: Mild (100F) (about 1 person in  10) Moderate (102F) (about 1 person in 9) Other problems: Headache (about 1 person in 3) Problems that could happen after any injected vaccine: People sometimes faint after a medical procedure, including vaccination. Sitting or lying down for about 15 minutes can help prevent fainting, and injuries caused by a fall. Tell your doctor if you feel dizzy, or have vision changes or ringing in the ears. Some people get severe pain in the shoulder and have difficulty moving the arm where a shot was given. This happens very rarely. Any medication can cause a severe allergic reaction. Such reactions from a vaccine are very rare, estimated at about 1 in a million doses, and would happen within a few minutes to a few hours after the vaccination. As with any medicine, there is a very remote chance of a vaccine causing a serious injury or death. The safety of vaccines is always being monitored. For more information, visit: http://www.aguilar.org/. 5. What if there is a serious reaction? What should I look for? Look for anything that concerns you, such as signs of a severe allergic reaction, very high fever, or unusual behavior. Signs of a severe allergic reaction can include hives, swelling of the face and throat, difficulty breathing, a fast heartbeat, dizziness, and weakness. These would usually start a few minutes to a few hours after the vaccination. What should I do? If you think it is a severe allergic reaction or other emergency that can't wait, call 9-1-1 or get to the nearest hospital. Otherwise, call your doctor. Afterward, the reaction should be reported to the Vaccine Adverse Event Reporting System (VAERS). Your doctor should file this  report, or you can do it yourself through the VAERS web site at www.vaers.SamedayNews.es, or by calling 614-050-6962. VAERS does not give medical advice. 6. The National Vaccine Injury Compensation Program The [employer] Vaccine Injury Compensation Program (VICP) is a  federal program that was created to compensate people who may have been injured by certain vaccines. Persons who believe they may have been injured by a vaccine can learn about the program and about filing a claim by calling 681-403-5084 or visiting the [employer] website at GoldCloset.com.ee. There is a time limit to file a claim for compensation. 7. How can I learn more? Ask your health care provider. He or she can give you the vaccine package insert or suggest other sources of information. Call your local or state health department. Contact the Centers for Disease Control and Prevention (CDC): Call 505-555-1138 (1-800-CDC-INFO) or Visit CDC's website at http://sweeney-todd.com/ Vaccine Information Statement, HPV Vaccine (06/21/2015) This information is not intended to replace advice given to you by your health care provider. Make sure you discuss any questions you have with your health care provider. Document Released: 01/31/2014 Document Revised: 03/26/2016 Document Reviewed: 03/26/2016 Elsevier Interactive Patient Education  2017 [employer].       Preparing for Pregnancy If you are considering becoming pregnant, make an appointment to see your regular health care provider to learn how to prepare for a safe and healthy pregnancy (preconception care). During a preconception care visit, your health care provider will:  Do a complete physical exam, including a Pap test.  Take a complete medical history.  Give you information, answer your questions, and help you resolve problems.  Preconception checklist Medical history  Tell your health care provider about any current or past medical conditions. Your pregnancy or your ability to become pregnant may be affected by chronic conditions, such as diabetes, chronic hypertension, and thyroid problems.  Include your family's medical history as well as your partner's medical history.  Tell your health care provider about any history  of STIs (sexually transmitted infections).These can affect your pregnancy. In some cases, they can be passed to your baby. Discuss any concerns that you have about STIs.  If indicated, discuss the benefits of genetic testing. This testing will show whether there are any genetic conditions that may be passed from you or your partner to your baby.  Tell your health care provider about: ? Any problems you have had with conception or pregnancy. ? Any medicines you take. These include vitamins, herbal supplements, and over-the-counter medicines. ? Your history of immunizations. Discuss any vaccinations that you may need.  Diet  Ask your health care provider what to include in a healthy diet that has a balance of nutrients. This is especially important when you are pregnant or preparing to become pregnant.  Ask your health care provider to help you reach a healthy weight before pregnancy. ? If you are overweight, you may be at higher risk for certain complications, such as high blood pressure, diabetes, and preterm birth. ? If you are underweight, you are more likely to have a baby who has a low birth weight.  Lifestyle, work, and home  Let your health care provider know: ? About any lifestyle habits that you have, such as alcohol use, drug use, or smoking. ? About recreational activities that may put you at risk during pregnancy, such as downhill skiing and certain exercise programs. ? Tell your health care provider about any international travel, especially any travel to places with an active [place]  virus outbreak. ? About harmful substances that you may be exposed to at work or at home. These include chemicals, pesticides, radiation, or even litter boxes. ? If you do not feel safe at home.  Mental health  Tell your health care provider about: ? Any history of mental health conditions, including feelings of depression, sadness, or anxiety. ? Any medicines that you take for a mental health  condition. These include herbs and supplements.  Home instructions to prepare for pregnancy Lifestyle  Eat a balanced diet. This includes fresh fruits and vegetables, whole grains, lean meats, low-fat dairy products, healthy fats, and foods that are high in fiber. Ask to meet with a nutritionist or registered dietitian for assistance with meal planning and goals.  Get regular exercise. Try to be active for at least 30 minutes a day on most days of the week. Ask your health care provider which activities are safe during pregnancy.  Do not use any products that contain nicotine or tobacco, such as cigarettes and e-cigarettes. If you need help quitting, ask your health care provider.  Do not drink alcohol.  Do not take illegal drugs.  Maintain a healthy weight. Ask your health care provider what weight range is right for you.  General instructions  Keep an accurate record of your menstrual periods. This makes it easier for your health care provider to determine your baby's due date.  Begin taking prenatal vitamins and folic acid supplements daily as directed by your health care provider.  Manage any chronic conditions, such as high blood pressure and diabetes, as told by your health care provider. This is important.  How do I know that I am pregnant? You may be pregnant if you have been sexually active and you miss your period. Symptoms of early pregnancy include:  Mild cramping.  Very light vaginal bleeding (spotting).  Feeling unusually tired.  Nausea and vomiting (morning sickness).  If you have any of these symptoms and you suspect that you might be pregnant, you can take a home pregnancy test. These tests check for a hormone in your urine (human chorionic gonadotropin, or hCG). A woman's body begins to make this hormone during early pregnancy. These tests are very accurate. Wait until at least the first day after you miss your period to take one. If the test shows that you are  pregnant (you get a positive result), call your health care provider to make an appointment for prenatal care. What should I do if I become pregnant?  Make an appointment with your health care provider as soon as you suspect you are pregnant.  Do not use any products that contain nicotine, such as cigarettes, chewing tobacco, and e-cigarettes. If you need help quitting, ask your health care provider.  Do not drink alcoholic beverages. Alcohol is related to a number of birth defects.  Avoid toxic odors and chemicals.  You may continue to have sexual intercourse if it does not cause pain or other problems, such as vaginal bleeding. This information is not intended to replace advice given to you by your health care provider. Make sure you discuss any questions you have with your health care provider. Document Released: 06/18/2008 Document Revised: 03/03/2016 Document Reviewed: 01/26/2016 Elsevier Interactive Patient Education  [employer] [place].

## 2017-09-22 NOTE — Progress Notes (Signed)
GYNECOLOGY ANNUAL PHYSICAL EXAM PROGRESS NOTE  Subjective:    Melinda Wells is a 32 y.o. G73P0010 female who presents to establish care, and for an annual exam.  The patient has no complaints today. The patient is sexually active.  The patient wears seatbelts: yes. The patient participates in regular exercise: no.  The patient reports that there is not domestic violence in her life.   1. Thinking of starting a family within the next year, having some general concerns regarding ability to become pregnant with h/o chlamydia and 1 termination.    Gynecologic History Menarche age: 27 Patient's last menstrual period was 08/22/2017.  Contraception: OCP (estrogen/progesterone) History of STI's: Chlamydia (3-4 years ago).  Last Pap: 3 years ago (performed in [place] Results were: normal.  Denies h/o abnormal pap smears.   Obstetric History   G1   P0   T0   P0   A1   L0    SAB0   TAB0   Ectopic0   Multiple0   Live Births0     # Outcome Date GA Lbr Len/2nd Weight Sex Delivery Anes PTL Lv  1 AB 2012              Past Medical History:  Diagnosis Date  . PCOS (polycystic ovarian syndrome)     History reviewed. No pertinent surgical history.  Family History  Problem Relation Age of Onset  . Hyperlipidemia Mother   . Diabetes Maternal Grandfather     Social History   Socioeconomic History  . Marital status: Married    Spouse name: Not on file  . Number of children: Not on file  . Years of education: Not on file  . Highest education level: Not on file  Social Needs  . Financial resource strain: Not on file  . Food insecurity - worry: Not on file  . Food insecurity - inability: Not on file  . Transportation needs - medical: Not on file  . Transportation needs - non-medical: Not on file  Occupational History  . Not on file  Tobacco Use  . Smoking status: Never Smoker  . Smokeless tobacco: Never Used  Substance and Sexual Activity  . Alcohol use: Yes    Comment:  Occasionally   . Drug use: No  . Sexual activity: Yes    Birth control/protection: Pill  Other Topics Concern  . Not on file  Social History Narrative  . Not on file    Current Outpatient Medications on File Prior to Visit  Medication Sig Dispense Refill  . meloxicam (MOBIC) 7.5 MG tablet Take by mouth.    . Norgestimate-Ethinyl Estradiol Triphasic 0.18/0.215/0.25 MG-35 MCG tablet Take by mouth.    . triamcinolone cream (KENALOG) 0.1 % Apply 1 application 2 (two) times daily topically.    . Vitamin D, Ergocalciferol, (DRISDOL) 50000 units CAPS capsule   5  . vitamin B-12 (CYANOCOBALAMIN) 1000 MCG tablet Take 1,000 mcg by mouth daily.     No current facility-administered medications on file prior to visit.     No Known Allergies   Review of Systems Constitutional: negative for chills, fatigue, fevers and sweats Eyes: negative for irritation, redness and visual disturbance Ears, nose, mouth, throat, and face: negative for hearing loss, nasal congestion, snoring and tinnitus Respiratory: negative for asthma, cough, sputum Cardiovascular: negative for chest pain, dyspnea, exertional chest pressure/discomfort, irregular heart beat, palpitations and syncope Gastrointestinal: negative for abdominal pain, change in bowel habits, nausea and vomiting Genitourinary: negative for abnormal  menstrual periods, genital lesions, sexual problems and vaginal discharge, dysuria and urinary incontinence Integument/breast: negative for breast lump, breast tenderness and nipple discharge Hematologic/lymphatic: negative for bleeding and easy bruising Musculoskeletal:negative for back pain and muscle weakness Neurological: negative for dizziness, headaches, vertigo and weakness Endocrine: negative for diabetic symptoms including polydipsia, polyuria and skin dryness Allergic/Immunologic: negative for hay fever and urticaria        Objective:  Blood pressure 99/70, pulse 87, height 5\' 2"  (1.575 m),  weight 154 lb 14.4 oz (70.3 kg), last menstrual period 08/22/2017. Body mass index is 28.33 kg/m.  General Appearance:    Alert, cooperative, no distress, appears stated age, overweight  Head:    Normocephalic, without obvious abnormality, atraumatic  Eyes:    PERRL, conjunctiva/corneas clear, EOM's intact, both eyes  Ears:    Normal external ear canals, both ears  Nose:   Nares normal, septum midline, mucosa normal, no drainage or sinus tenderness  Throat:   Lips, mucosa, and tongue normal; teeth and gums normal  Neck:   Supple, symmetrical, trachea midline, no adenopathy; thyroid: no enlargement/tenderness/nodules; no carotid bruit or JVD  Back:     Symmetric, no curvature, ROM normal, no CVA tenderness  Lungs:     Clear to auscultation bilaterally, respirations unlabored  Chest Wall:    No tenderness or deformity   Heart:    Regular rate and rhythm, S1 and S2 normal, no murmur, rub or gallop  Breast Exam:    No tenderness, masses, or nipple abnormality  Abdomen:     Soft, non-tender, bowel sounds active all four quadrants, no masses, no organomegaly.    Genitalia:    Pelvic:external genitalia normal, vagina without lesions, or tenderness. Scant brown discharge in vaginal vault. Rectovaginal septum  normal. Cervix normal in appearance, no cervical motion tenderness, no adnexal masses or tenderness.  Uterus normal size, shape, mobile, regular contours, nontender.  Rectal:    Normal external sphincter.  No hemorrhoids appreciated. Internal exam not done.   Extremities:   Extremities normal, atraumatic, no cyanosis or edema  Pulses:   2+ and symmetric all extremities  Skin:   Skin color, texture, turgor normal, no rashes or lesions  Lymph nodes:   Cervical, supraclavicular, and axillary nodes normal  Neurologic:   CNII-XII intact, normal strength, sensation and reflexes throughout   .  Labs:  Lab Results  Component Value Date   WBC 11.5 (H) 06/07/2017   HGB 13.2 06/07/2017   HCT 40.6  06/07/2017   MCV 86.3 06/07/2017   PLT 354 06/07/2017    Lab Results  Component Value Date   CREATININE 0.62 05/10/2017   BUN 9 05/10/2017   NA 137 05/10/2017   K 3.4 (L) 05/10/2017   CL 100 (L) 05/10/2017   CO2 27 05/10/2017    Lab Results  Component Value Date   ALT 14 05/10/2017   AST 25 05/10/2017   ALKPHOS 96 05/10/2017   BILITOT 0.5 05/10/2017    No results found for: TSH   Assessment:   Encounter for gynecological examination without abnormal finding Overweight (BMI 25.0-29.9) Cervical cancer screening  Plan:     Blood tests: Up to date.  Breast self exam technique reviewed and patient encouraged to perform self-exam monthly. Contraception: OCP (estrogen/progesterone).  Is thinking about starting a family within the next year. Discussed that once she discontinues OCPs, she should begin taking a PNV daily.  Also informed that 1 abortion should not have any bearings on future fertility. With regards  to her h/o Chlamydia, patient was not diagnosed with PID and so should not have any difficulties with fertility, but did inform of potential workup if infertility was diagnosed.  Discussed healthy lifestyle modifications. Pap smear performed today. Patient has received flu vaccine for this season.   Given handout on HPV vaccination.  Follow up for annual exam in 1 year.     Hildred Laserherry, Anaika Santillano, MD [facility]

## 2017-09-22 NOTE — Progress Notes (Signed)
Pt is doing well no concerns.  

## 2017-09-25 LAB — IGP, COBASHPV16/18
HPV 16: NEGATIVE
HPV 18: NEGATIVE
HPV OTHER HR TYPES: NEGATIVE
PAP Smear Comment: 0

## 2017-11-26 ENCOUNTER — Other Ambulatory Visit: Payer: Self-pay | Admitting: Internal Medicine

## 2017-11-29 ENCOUNTER — Other Ambulatory Visit: Payer: Self-pay | Admitting: Internal Medicine

## 2017-11-29 DIAGNOSIS — M545 Low back pain: Principal | ICD-10-CM

## 2017-11-29 DIAGNOSIS — M5416 Radiculopathy, lumbar region: Secondary | ICD-10-CM

## 2017-11-29 DIAGNOSIS — G8929 Other chronic pain: Secondary | ICD-10-CM

## 2017-12-01 ENCOUNTER — Other Ambulatory Visit: Payer: Self-pay | Admitting: Internal Medicine

## 2017-12-01 DIAGNOSIS — M5416 Radiculopathy, lumbar region: Secondary | ICD-10-CM

## 2017-12-01 DIAGNOSIS — G8929 Other chronic pain: Secondary | ICD-10-CM

## 2017-12-01 DIAGNOSIS — M545 Low back pain: Principal | ICD-10-CM

## 2017-12-20 ENCOUNTER — Ambulatory Visit
Admission: RE | Admit: 2017-12-20 | Discharge: 2017-12-20 | Disposition: A | Discharge status: Home / Self Care | Payer: No Typology Code available for payment source | Source: Ambulatory Visit | Attending: Internal Medicine | Admitting: Internal Medicine

## 2017-12-20 DIAGNOSIS — G8929 Other chronic pain: Secondary | ICD-10-CM | POA: Diagnosis present

## 2017-12-20 DIAGNOSIS — M48061 Spinal stenosis, lumbar region without neurogenic claudication: Secondary | ICD-10-CM | POA: Diagnosis not present

## 2017-12-20 DIAGNOSIS — M5416 Radiculopathy, lumbar region: Secondary | ICD-10-CM | POA: Diagnosis not present

## 2017-12-20 DIAGNOSIS — M545 Low back pain: Secondary | ICD-10-CM | POA: Diagnosis present

## 2017-12-20 DIAGNOSIS — M5136 Other intervertebral disc degeneration, lumbar region: Secondary | ICD-10-CM | POA: Diagnosis not present

## 2017-12-20 DIAGNOSIS — Z0000 Encounter for general adult medical examination without abnormal findings: Secondary | ICD-10-CM | POA: Diagnosis not present

## 2017-12-20 MED ORDER — GADOBENATE DIMEGLUMINE 529 MG/ML IV SOLN
15.0000 mL | Freq: Once | INTRAVENOUS | Status: AC | PRN
  Administered 2017-12-20: 15 mL via INTRAVENOUS

## 2018-02-07 ENCOUNTER — Other Ambulatory Visit: Payer: Self-pay | Admitting: Obstetrics and Gynecology

## 2018-02-07 DIAGNOSIS — Z3041 Encounter for surveillance of contraceptive pills: Secondary | ICD-10-CM

## 2018-08-10 ENCOUNTER — Encounter: Payer: Self-pay | Admitting: Urology

## 2018-08-10 ENCOUNTER — Ambulatory Visit: Payer: No Typology Code available for payment source | Admitting: Urology

## 2018-08-10 VITALS — BP 117/72 | HR 69 | Ht 62.0 in | Wt 160.0 lb

## 2018-08-10 DIAGNOSIS — R3129 Other microscopic hematuria: Secondary | ICD-10-CM

## 2018-08-10 LAB — URINALYSIS, COMPLETE
BILIRUBIN UA: NEGATIVE
Glucose, UA: NEGATIVE
LEUKOCYTES UA: NEGATIVE
Nitrite, UA: NEGATIVE
PH UA: 6 (ref 5.0–7.5)
PROTEIN UA: NEGATIVE
Specific Gravity, UA: 1.01 (ref 1.005–1.030)
UUROB: 0.2 mg/dL (ref 0.2–1.0)

## 2018-08-10 LAB — MICROSCOPIC EXAMINATION
BACTERIA UA: NONE SEEN
EPITHELIAL CELLS (NON RENAL): NONE SEEN /HPF (ref 0–10)
WBC UA: NONE SEEN /HPF (ref 0–5)

## 2018-08-10 NOTE — Progress Notes (Addendum)
08/10/2018 1:18 PM   Cheresa Stayton Janice 10/18/1985 [id number]  Referring provider: Barbette Reichmann, MD [address] [facility] [place], [place] [address]  Chief Complaint  Patient presents with  . Hematuria    New Patient   HPI: Melinda Wells is a 33 yo F who presents today for the evaluation and management of microscopic hematuria. She was referred to [place] by Barbette Reichmann, MD.  She reports of no gross hematuria.  No flank pain.  No urinary symptoms.    She denies vaginal bleeding at the time of UAs. She is not aware of her FHx of bladder cancer or urinary symptoms. She is not a smoker and has not had exposure to industrial chemicals.   No history of stones.   Her UA today is negative.   No recent upper tract imaging.  UA history positive for  4-10 RBC:  07/04/18 06/01/18 10/18/17 01/12/17   Creatinine is normal 0.6 from 05/01/2018.  PMH: Past Medical History:  Diagnosis Date  . PCOS (polycystic ovarian syndrome)     Surgical History: No past surgical history on file.  Home Medications:  Allergies as of 08/10/2018   No Known Allergies     Medication List       Accurate as of August 10, 2018  1:18 PM. Always use your most recent med list.        Norgestimate-Ethinyl Estradiol Triphasic 0.18/0.215/0.25 MG-35 MCG tablet Take by mouth.   TRI-LINYAH 0.18/0.215/0.25 MG-35 MCG tablet Generic drug:  Norgestimate-Ethinyl Estradiol Triphasic TAKE 1 TABLET BY MOUTH DAILY.   triamcinolone cream 0.1 % Commonly known as:  KENALOG Apply 1 application 2 (two) times daily topically.   vitamin B-12 1000 MCG tablet Commonly known as:  CYANOCOBALAMIN Take 1,000 mcg by mouth daily.   Vitamin D (Ergocalciferol) 1.25 MG (50000 UT) Caps capsule Commonly known as:  DRISDOL       Allergies: No Known Allergies  Family History: Family History  Problem Relation Age of Onset  . Hyperlipidemia Mother   . Diabetes Maternal Grandfather     Social  History:  reports that she has never smoked. She has never used smokeless tobacco. She reports current alcohol use. She reports that she does not use drugs.  ROS: UROLOGY Frequent Urination?: No Hard to postpone urination?: No Burning/pain with urination?: No Get up at night to urinate?: Yes Leakage of urine?: No Urine stream starts and stops?: No Trouble starting stream?: No Do you have to strain to urinate?: No Blood in urine?: Yes Urinary tract infection?: No Sexually transmitted disease?: No Injury to kidneys or bladder?: No Painful intercourse?: No Weak stream?: No Currently pregnant?: No Vaginal bleeding?: No Last menstrual period?: n  Gastrointestinal Nausea?: No Vomiting?: No Indigestion/heartburn?: No Diarrhea?: No Constipation?: No  Constitutional Fever: No Night sweats?: No Weight loss?: No Fatigue?: Yes  Skin Skin rash/lesions?: No Itching?: No  Eyes Blurred vision?: No Double vision?: No  Ears/Nose/Throat Sore throat?: No Sinus problems?: No  Hematologic/Lymphatic Swollen glands?: No Easy bruising?: No  Cardiovascular Leg swelling?: No Chest pain?: No  Respiratory Cough?: No Shortness of breath?: No  Endocrine Excessive thirst?: No  Musculoskeletal Back pain?: No Joint pain?: No  Neurological Headaches?: No Dizziness?: No  Psychologic Depression?: No Anxiety?: No  Physical Exam: BP 117/72   Pulse 69   Ht 5\' 2"  (1.575 m)   Wt 160 lb (72.6 kg)   BMI 29.26 kg/m   Constitutional:  Alert and oriented, No acute distress. HEENT:  [place] AT, moist mucus membranes.  Trachea midline, no masses. Cardiovascular: No clubbing, cyanosis, or edema. Respiratory: Normal respiratory effort, no increased work of breathing. GI: Abdomen is soft, nontender, nondistended, no abdominal masses GU: No CVA tenderness Skin: No rashes, bruises or suspicious lesions. Neurologic: Grossly intact, no focal deficits, moving all 4 extremities. Psychiatric:  Normal mood and affect.  Laboratory Data: Labs from care everywhere from 05/2018 were personally reviewed today  Urinalysis UA today is negative. See Epic   Assessment & Plan:    1. Microscopic hematuria  We discussed the differential diagnosis for microscopic hematuria including nephrolithiasis, vaginal bleeding,  renal or upper tract tumors, bladder stones, UTIs, or bladder tumors as well as undetermined etiologies.  Underlying medical renal disease is also in the differential though not suspected.   Explained to pt per AUA guidelines, given her age of under 25 and lack of risk factors, she is low risk for any pathology  UA today was negative  I recommended a modified hematuria work-up with renal ultrasound and consideration of cystoscopy.  We discussed the risks and benefits of cystoscopy in detail.  The likelihood of identifying any underlying bladder pathology is extremely low.  She like to think about whether or not to want to pursue cystoscopy.  Return in about 4 weeks for possible cystoscopy/RUS   Return in about 4 weeks (around 09/07/2018) for possible cystoscopy/RUS.   [facility] [address] [place], [place] [address] [phone]  I, Donne Hazel, am acting as a scribe for Dr. Vanna Scotland,  I have reviewed the above documentation for accuracy and completeness, and I agree with the above.   Vanna Scotland, MD

## 2018-09-08 NOTE — Progress Notes (Signed)
   09/14/2018   CC:  Chief Complaint  Patient presents with  . Follow-up    HPI: Melinda Wells is a 33 yo F who presents today for a cystoscopy/RUS for the evaluation and management of microscopic hematuria.   She denies vaginal bleeding at the time of UAs. She is not aware of her FHx of bladder cancer or urinary symptoms. She is not a smoker and has not had exposure to industrial chemicals.   No history of stones.  UA history positive for  4-10 RBC:  07/04/18 06/01/18 10/18/17 01/12/17   Creatinine is normal 0.6 from 05/01/2018.  Her RUS from 09/12/2018 showed no cause for microscopic hematuria.   She denies gross hematuria, however, she feels like she had experienced blood in her urine once during her teenage years but not completely sure. She denies burning or UTI symptoms.  Her UA today is positive for 3-10 RBCs.   Vitals:   09/14/18 0925  BP: 99/63  Pulse: 76   NED. A&Ox3.   No respiratory distress   Abd soft, NT, ND Normal external genitalia with patent urethral meatus  Cystoscopy Procedure Note  Patient identification was confirmed, informed consent was obtained, and patient was prepped using Betadine solution.  Lidocaine jelly was administered per urethral meatus.    Procedure: - Flexible cystoscope introduced, without any difficulty.   - Thorough search of the bladder revealed:    normal urethral meatus    normal urothelium    no stones    no ulcers     no tumors    no urethral polyps    no trabeculation    Chronic inflammation of bladder cystitis cystica, mild inflammatory change at bladder neck     Posterior bladder wall uterine impression   - Ureteral orifices were normal in position and appearance.  Post-Procedure: - Patient tolerated the procedure well  Pertinent Imagings: CLINICAL DATA:  Microscopic hematuria.  EXAM: RENAL / URINARY TRACT ULTRASOUND COMPLETE  COMPARISON:  None.  FINDINGS: Right Kidney:  Renal measurements: 11.6 x  4.9 x 4.8 cm = volume: 143 mL . Echogenicity within normal limits. No mass or hydronephrosis visualized.  Left Kidney:  Renal measurements: 10.7 x 5.1 x 5.2 cm = volume: 146 mL. Echogenicity within normal limits. No mass or hydronephrosis visualized.  Bladder:  Appears normal for degree of bladder distention.  IMPRESSION: 1. Normal study.  No cause for microhematuria identified.   Electronically Signed   By: Gerome Sam III M.D   On: 09/12/2018 17:28  I have personally reviewed the images and agree with radiologist interpretation.   Laboratory Section:   Urinalysis   Her UA today positive for 3-10 RBCs.   Assessment/ Plan:  1. Microscopic hematuria  UA today positive for 3-10 RBCs RUS from 09/12/2018 showed no cause for microscopic hematuria.  Cystoscopy showed findings consistent with mild cystitis cystica, mild inflammatory change without significant pathology.  I recommended that if she has persistent microscopic hematuria at age 17, she should be referred back to [place] for further evaluation.  Solon Augusta, am acting as a scribe for Dr. Vanna Scotland,  I have reviewed the above documentation for accuracy and completeness, and I agree with the above.   Vanna Scotland, MD

## 2018-09-12 ENCOUNTER — Ambulatory Visit
Admission: RE | Admit: 2018-09-12 | Discharge: 2018-09-12 | Disposition: A | Discharge status: Home / Self Care | Payer: No Typology Code available for payment source | Source: Ambulatory Visit | Attending: Urology | Admitting: Urology

## 2018-09-12 DIAGNOSIS — R3129 Other microscopic hematuria: Secondary | ICD-10-CM | POA: Diagnosis present

## 2018-09-14 ENCOUNTER — Encounter: Payer: Self-pay | Admitting: Urology

## 2018-09-14 ENCOUNTER — Ambulatory Visit: Payer: No Typology Code available for payment source | Admitting: Urology

## 2018-09-14 VITALS — BP 99/63 | HR 76 | Ht 62.0 in | Wt 160.0 lb

## 2018-09-14 DIAGNOSIS — R3129 Other microscopic hematuria: Secondary | ICD-10-CM

## 2018-09-14 LAB — URINALYSIS, COMPLETE
Bilirubin, UA: NEGATIVE
GLUCOSE, UA: NEGATIVE
Ketones, UA: NEGATIVE
LEUKOCYTES UA: NEGATIVE
Nitrite, UA: NEGATIVE
PROTEIN UA: NEGATIVE
Specific Gravity, UA: >1.03 — ABNORMAL HIGH (ref 1.005–1.030)
Urobilinogen, Ur: 0.2 mg/dL (ref 0.2–1.0)
pH, UA: 5 (ref 5.0–7.5)

## 2018-09-14 LAB — MICROSCOPIC EXAMINATION

## 2018-09-27 IMAGING — MR MR LUMBAR SPINE WO/W CM
6 of 7 series · 34 of 48 positions shown · IV contrast (14mL MULTIHANCE)
Comparison: Report of [REDACTED] lumbar radiographs 01/12/2017
(no images available).

CLINICAL DATA: 32-year-old female with intermittent lumbar back
pain for 1 year radiating to both legs worse on the right side. No
known injury.

EXAM:
MRI LUMBAR SPINE WITHOUT AND WITH CONTRAST
TECHNIQUE: Multiplanar and multiecho pulse sequences of the lumbar spine were
obtained without and with intravenous contrast.
CONTRAST:  15mL MULTIHANCE GADOBENATE DIMEGLUMINE 529 MG/ML IV SOLN

[Series 2: T2 · sagittal · 4.0mm · 0.81mm/px · 4 of 15 slices shown (1 of 2)]
[im 1/15]
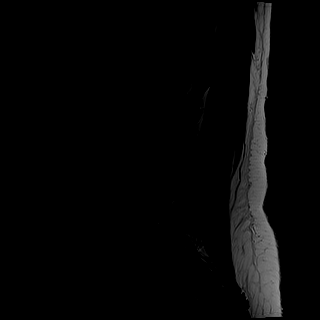
[im 5/15]
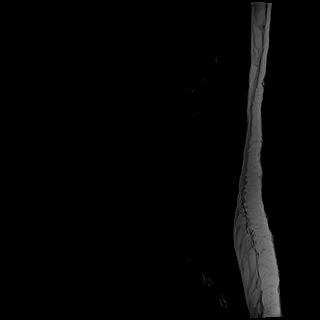
[im 10/15]
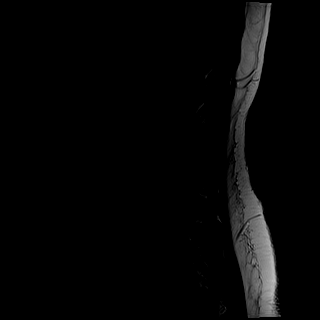
[im 15/15]
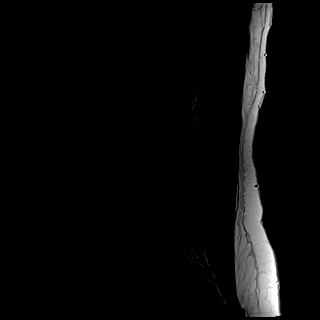

[Series 3: T1 · sagittal · 4.0mm · 0.81mm/px · 4 of 15 slices shown (1 of 2)]
[im 1/15]
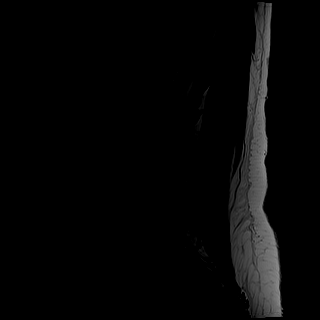
[im 5/15]
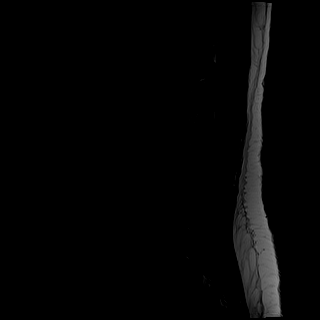
[im 10/15]
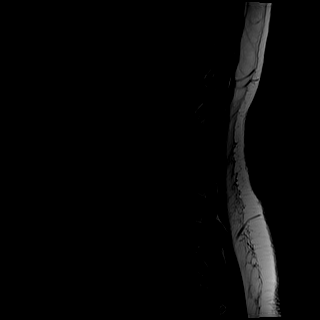
[im 15/15]
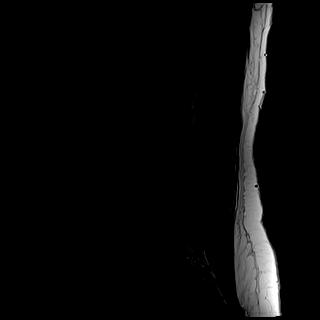

[Series 4: STIR · sagittal · 4.0mm · 0.81mm/px · 5 of 15 slices shown]
[im 1/15]
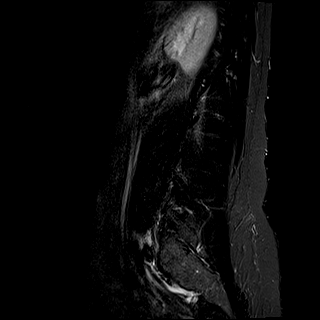
[im 4/15]
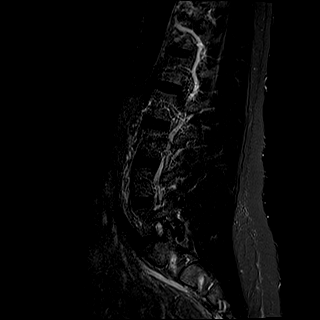
[im 8/15]
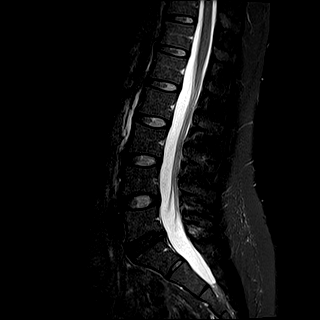
[im 11/15]
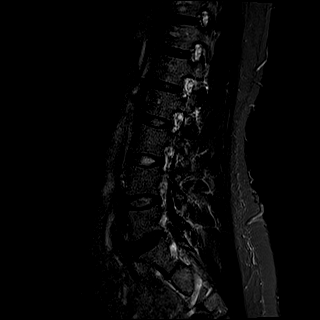
[im 15/15]
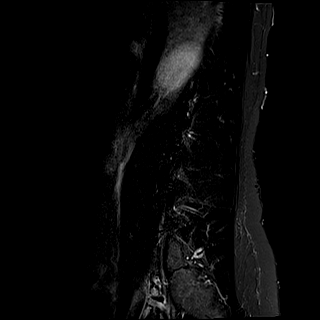

[Series 5: T2 · axial · 4.0mm · 0.78mm/px · z∈[-36,+137]mm · 8 of 30 slices shown (2 of 2)]
[im 1/30]
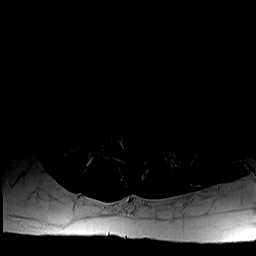
[im 4/30]
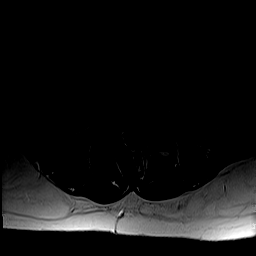
[im 10/30]
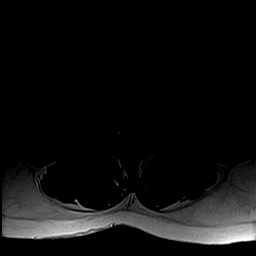
[im 13/30]
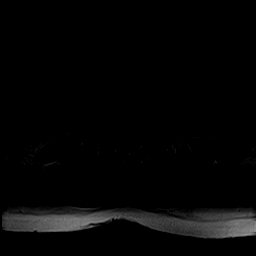
[im 17/30]
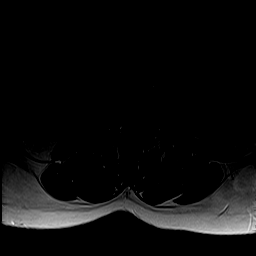
[im 20/30]
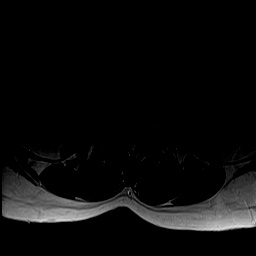
[im 26/30]
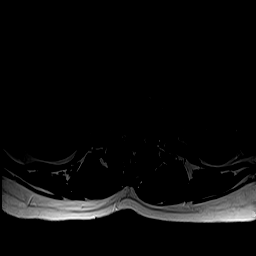
[im 30/30]
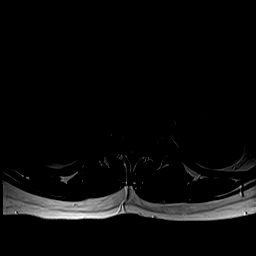

[Series 6: T1 · axial · 4.0mm · 0.39mm/px · z∈[-36,+137]mm · 8 of 30 slices shown (2 of 2)]
[im 1/30]
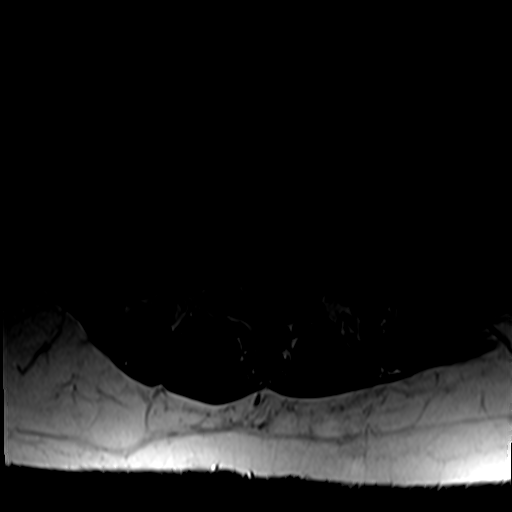
[im 4/30]
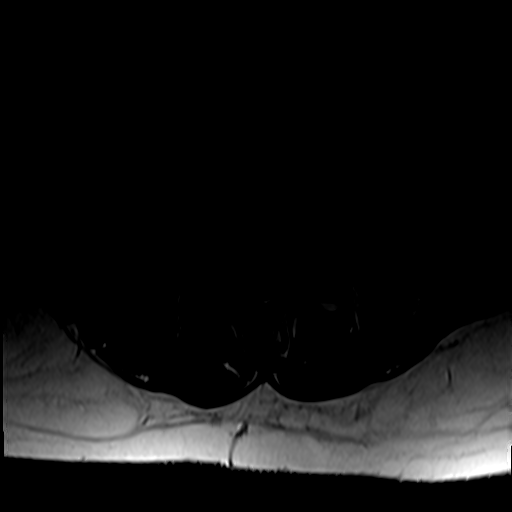
[im 10/30]
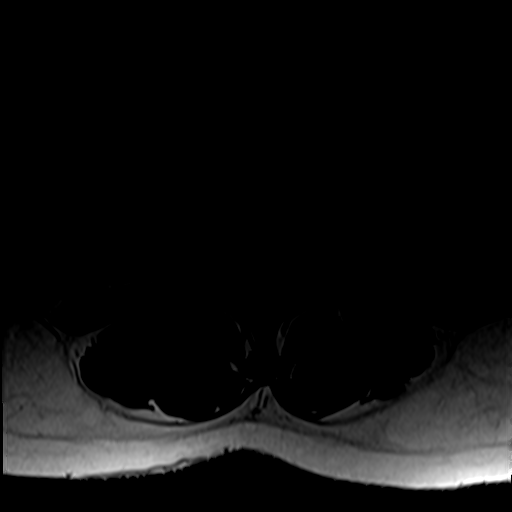
[im 13/30]
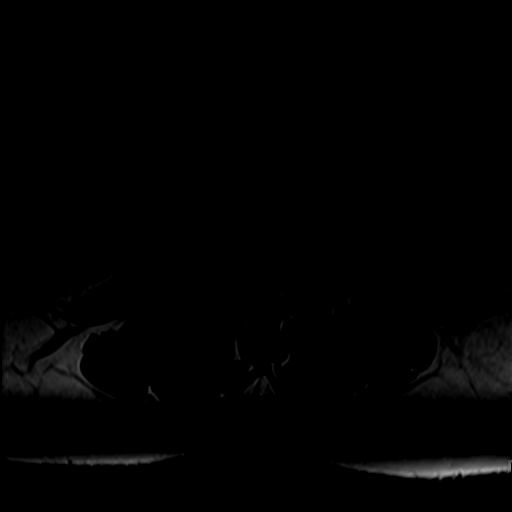
[im 17/30]
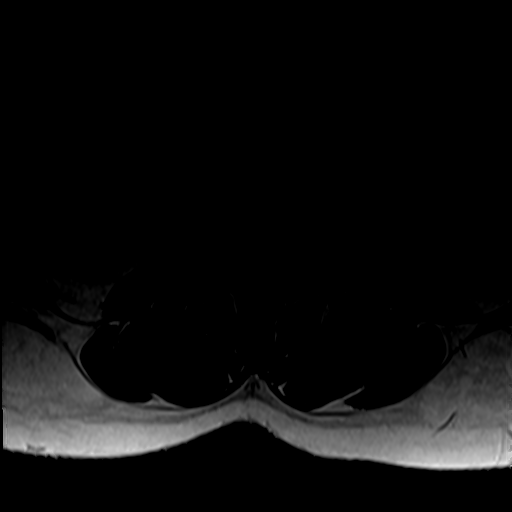
[im 20/30]
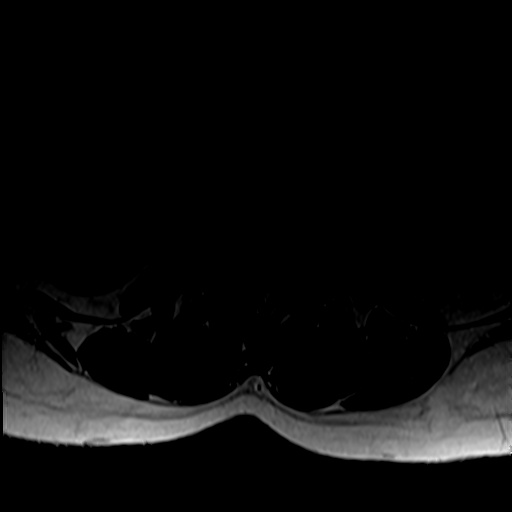
[im 26/30]
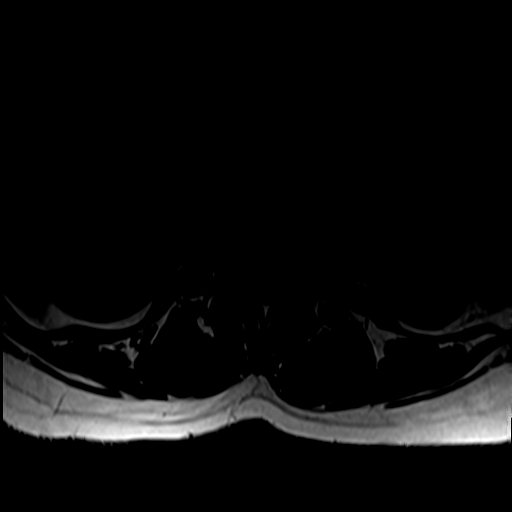
[im 30/30]
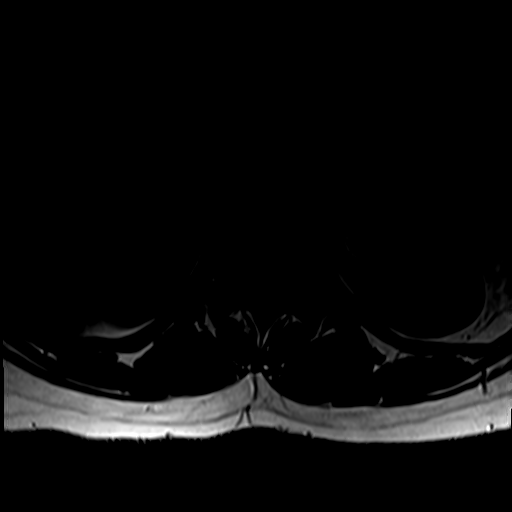

[Series 7: T1 fat-sat post-contrast · sagittal · 4.0mm · 0.81mm/px · 5 of 15 slices shown]
[im 1/15]
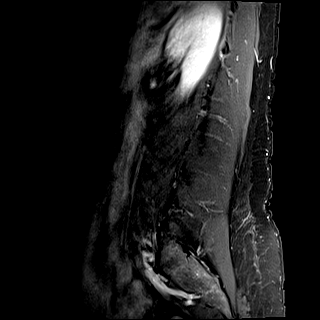
[im 4/15]
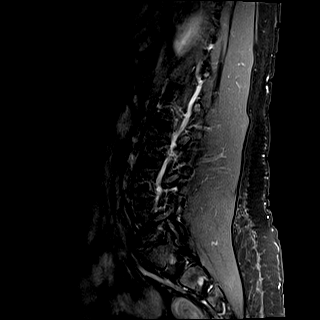
[im 8/15]
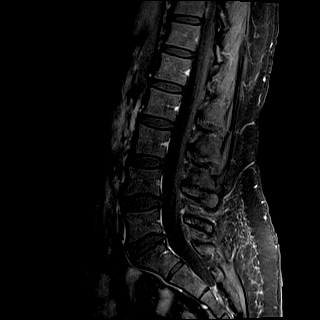
[im 11/15]
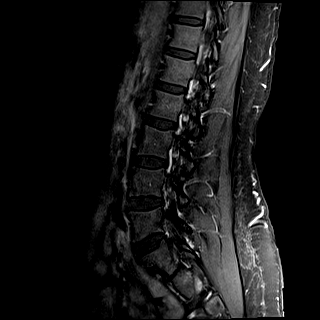
[im 15/15]
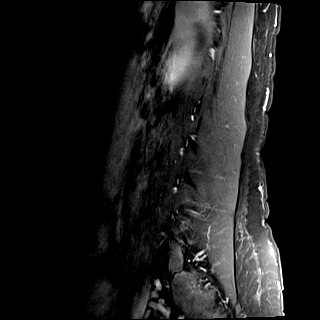

[34 of 48 positions shown; findings below may reference images not displayed]

FINDINGS: Segmentation: Lumbar segmentation appears to be normal and will be
designated as such for this report.

Alignment:  Normal vertebral height and alignment.

Vertebrae: No marrow edema or evidence of acute osseous abnormality.
Visualized bone marrow signal is within normal limits. Intact
visible sacrum and SI joints.

Conus medullaris and cauda equina: Conus extends to the L1 level.

Conus and cauda equina appear normal. No abnormal intradural
enhancement. Capacious spinal canal.

Paraspinal and other soft tissues: Negative.

Disc levels:

T11-T12: Negative.

T12-L1:  Negative.

L1-L2:  Negative.

L2-L3: Normal disc. Minimal to mild facet hypertrophy. No stenosis.

L3-L4:  Negative.

L4-L5: Minimal foraminal disc bulging. Mild facet hypertrophy. No
stenosis.

L5-S1: Disc desiccation. Mild disc bulging, most pronounced in the
right L5 neural foramen where there is also a subtle annular fissure
of the disc (series 2, image 4). Mild facet hypertrophy. Small
posteriorly situated synovial cyst on the right (series 5, image 25
and series 4, image 3). No spinal or lateral recess stenosis. Mild
left and mild to moderate right L5 neural foraminal stenosis.
IMPRESSION: Lumbar disc degeneration at L5-S1 with mild broad-based right
foraminal disc bulge or protrusion with small annular fissure.
Superimposed degenerative facet hypertrophy at that level.
Subsequent right greater than left foraminal stenosis. Query right
L5 radiculitis.

## 2018-09-28 ENCOUNTER — Encounter: Payer: No Typology Code available for payment source | Admitting: Obstetrics and Gynecology

## 2018-11-04 ENCOUNTER — Telehealth: Payer: Self-pay

## 2018-11-04 NOTE — Telephone Encounter (Signed)
Pt was called and rescheduled her annual exam with [facility].

## 2018-11-15 ENCOUNTER — Encounter: Payer: No Typology Code available for payment source | Admitting: Obstetrics and Gynecology

## 2019-02-10 ENCOUNTER — Encounter: Payer: Self-pay | Admitting: Obstetrics and Gynecology

## 2019-02-10 ENCOUNTER — Ambulatory Visit (INDEPENDENT_AMBULATORY_CARE_PROVIDER_SITE_OTHER): Payer: No Typology Code available for payment source | Admitting: Obstetrics and Gynecology

## 2019-02-10 ENCOUNTER — Other Ambulatory Visit: Payer: Self-pay

## 2019-02-10 VITALS — BP 115/72 | HR 84 | Ht 62.0 in | Wt 155.5 lb

## 2019-02-10 DIAGNOSIS — Z3169 Encounter for other general counseling and advice on procreation: Secondary | ICD-10-CM | POA: Diagnosis not present

## 2019-02-10 DIAGNOSIS — Z01419 Encounter for gynecological examination (general) (routine) without abnormal findings: Secondary | ICD-10-CM | POA: Diagnosis not present

## 2019-02-10 DIAGNOSIS — E663 Overweight: Secondary | ICD-10-CM | POA: Diagnosis not present

## 2019-02-10 NOTE — Progress Notes (Signed)
GYNECOLOGY ANNUAL PHYSICAL EXAM PROGRESS NOTE  Subjective:    Melinda Wells is a 33 y.o. [id number] female who presents for an annual exam.   The patient has no new complaints today.  She plans to start trying to get pregnant, plans to start trying 06/2019. She's already taking a prenatal vitamin. Stopped OCP 2 months ago. She hasn't had a period since stopping, but pregnancy test at home 2 days ago was negative. She's always had an irregular period.   She tries to get exercise and eat healthy diet.  The patient has a PCP, Hande, Vishwanath, MD.    She has a PMH of  Past Medical History:  Diagnosis Date  . Degeneration of cervical intervertebral disc   . PCOS (polycystic ovarian syndrome)     Gynecologic History Patient's last menstrual period was 01/08/2019. Menarche age: [age] Patient's last menstrual period was Patient's last menstrual period was Patient's last menstrual period was 01/08/2019. Contraception: not currently taking OCP (estrogen/progesterone)- stopped 2 months ago  History of STI's: Chlamydia (3-4 years ago).  Last Pap: 09/22/2017, normal.  Denies h/o abnormal pap smears.     OB History  Gravida Para Term Preterm AB Living  1 0 0 0 1 0  SAB TAB Ectopic Multiple Live Births  0 0 0 0 0    # Outcome Date GA Lbr Len/2nd Weight Sex Delivery Anes PTL Lv  1 AB 2012             Past Surgical History:  Procedure Laterality Date  . NO PAST SURGERIES      Family History  Problem Relation Age of Onset  . Hyperlipidemia Mother   . Diabetes Maternal Grandfather     Social History   Socioeconomic History  . Marital status: Married    Spouse name: Not on file  . Number of children: Not on file  . Years of education: Not on file  . Highest education level: Not on file  Occupational History  . Not on file  Social Needs  . Financial resource strain: Not on file  . Food insecurity    Worry: Not on file    Inability: Not on file  . Transportation needs     Medical: Not on file    Non-medical: Not on file  Tobacco Use  . Smoking status: Never Smoker  . Smokeless tobacco: Never Used  Substance and Sexual Activity  . Alcohol use: Yes    Comment: Occasionally   . Drug use: No  . Sexual activity: Yes    Birth control/protection: None  Lifestyle  . Physical activity    Days per week: Not on file    Minutes per session: Not on file  . Stress: Not on file  Relationships  . Social Musicianconnections    Talks on phone: Not on file    Gets together: Not on file    Attends religious service: Not on file    Active member of club or organization: Not on file    Attends meetings of clubs or organizations: Not on file    Relationship status: Not on file  . Intimate partner violence    Fear of current or ex partner: Not on file    Emotionally abused: Not on file    Physically abused: Not on file    Forced sexual activity: Not on file  Other Topics Concern  . Not on file  Social History Narrative  . Not on file  Current Outpatient Medications on File Prior to Visit  Medication Sig Dispense Refill  . Prenatal Vit-Fe Fumarate-FA (MULTIVITAMIN-PRENATAL) 27-0.8 MG TABS tablet Take 1 tablet by mouth daily at 12 noon.    . vitamin B-12 (CYANOCOBALAMIN) 1000 MCG tablet Take 1,000 mcg by mouth daily.    . Vitamin D, Ergocalciferol, (DRISDOL) 50000 units CAPS capsule   5  . Norgestimate-Ethinyl Estradiol Triphasic 0.18/0.215/0.25 MG-35 MCG tablet Take by mouth.    Fonda Kinder 0.18/0.215/0.25 MG-35 MCG tablet TAKE 1 TABLET BY MOUTH DAILY. (Patient not taking: Reported on 02/10/2019) 28 tablet 8  . triamcinolone cream (KENALOG) 0.1 % Apply 1 application 2 (two) times daily topically.     No current facility-administered medications on file prior to visit.     No Known Allergies    Review of Systems Constitutional: negative for chills, fatigue, fevers and sweats Eyes: negative for irritation, redness and visual disturbance Ears, nose, mouth, throat,  and face: negative for hearing loss, nasal congestion, snoring and tinnitus Respiratory: negative for asthma, cough, sputum Cardiovascular: negative for chest pain, dyspnea, exertional chest pressure/discomfort, irregular heart beat, palpitations and syncope Gastrointestinal: negative for abdominal pain, change in bowel habits, nausea and vomiting Genitourinary: negative for abnormal menstrual periods, genital lesions, sexual problems and vaginal discharge, dysuria and urinary incontinence Integument/breast: negative for breast lump, breast tenderness and nipple discharge Hematologic/lymphatic: negative for bleeding and easy bruising Musculoskeletal:negative for back pain and muscle weakness Neurological: negative for dizziness, headaches, vertigo and weakness Endocrine: negative for diabetic symptoms including polydipsia, polyuria and skin dryness Allergic/Immunologic: negative for hay fever and urticaria        Objective:  Blood pressure 115/72, pulse 84, height 5\' 2"  (1.575 m), weight 70.5 kg, last menstrual period 01/08/2019. Body mass index is 28.44 kg/m.  General Appearance:    Alert, cooperative, no distress, appears stated age, overweight  Head:    Normocephalic, without obvious abnormality, atraumatic  Eyes:    PERRL, conjunctiva/corneas clear, EOM's intact, both eyes  Ears:    Normal external ear canals, both ears  Nose:   Nares normal, septum midline, mucosa normal, no drainage or sinus tenderness  Throat:   Lips, mucosa, and tongue normal; teeth and gums normal  Neck:   Supple, symmetrical, trachea midline, no adenopathy; thyroid: no enlargement/tenderness/nodules; no carotid bruit or JVD  Back:     Symmetric, no curvature, ROM normal, no CVA tenderness  Lungs:     Clear to auscultation bilaterally, respirations unlabored  Chest Wall:    No tenderness or deformity   Heart:    Regular rate and rhythm, S1 and S2 normal, no murmur, rub or gallop  Breast Exam:    No tenderness,  masses, or nipple abnormality  Abdomen:     Soft, non-tender, bowel sounds active all four quadrants, no masses, no organomegaly.    Genitalia:    Pelvic:external genitalia normal, vagina without lesions, discharge, or tenderness, rectovaginal septum  normal. Cervix normal in appearance, no cervical motion tenderness, no adnexal masses or tenderness.  Uterus normal size, shape, mobile, regular contours, nontender.  Rectal:    Normal external sphincter.  No hemorrhoids appreciated. Internal exam not done.   Extremities:   Extremities normal, atraumatic, no cyanosis or edema  Pulses:   2+ and symmetric all extremities  Skin:   Skin color, texture, turgor normal, no rashes or lesions  Lymph nodes:   Cervical, supraclavicular, and axillary nodes normal  Neurologic:   CNII-XII intact, normal strength, sensation and reflexes throughout   .  Labs:  Lab Results  Component Value Date   WBC 11.5 (H) 06/07/2017   HGB 13.2 06/07/2017   HCT 40.6 06/07/2017   MCV 86.3 06/07/2017   PLT 354 06/07/2017    Lab Results  Component Value Date   CREATININE 0.62 05/10/2017   BUN 9 05/10/2017   NA 137 05/10/2017   K 3.4 (L) 05/10/2017   CL 100 (L) 05/10/2017   CO2 27 05/10/2017    Lab Results  Component Value Date   ALT 14 05/10/2017   AST 25 05/10/2017   ALKPHOS 96 05/10/2017   BILITOT 0.5 05/10/2017    No results found for: TSH   Assessment:    Healthy female exam.    Overweight  Preconception counseling  Plan:     Pap smear up to date.  Blood tests: CBC, CMP Breast self exam technique reviewed and patient encouraged to perform self-exam monthly. Contraception: None.  Preconception counseling provided. Rec pt track her fertility using app, and regular intercourse during fertile week. Pt plans to start trying to conceive in the next 5 months, stopped OCP 2 months ago. Already taking PNV.  Discussed healthy lifestyle modifications.    Rhys MartiniGraham, Laura E, Student-PA [facility]  Ignacia BayleyLaura Graham, PA-S 02/10/19      I have seen and examined the patient with Ignacia BayleyLaura Graham, [place] PA-S.  I have reviewed the record and concur with patient management and plan.   Hildred Laserherry, Diago Haik, MD [facility]

## 2019-02-10 NOTE — Patient Instructions (Signed)
Preventive Care 21-32 Years Old, Female Preventive care refers to visits with your health care provider and lifestyle choices that can promote health and wellness. This includes:  A yearly physical exam. This may also be called an annual well check.  Regular dental visits and eye exams.  Immunizations.  Screening for certain conditions.  Healthy lifestyle choices, such as eating a healthy diet, getting regular exercise, not using drugs or products that contain nicotine and tobacco, and limiting alcohol use. What can I expect for my preventive care visit? Physical exam Your health care provider will check your:  Height and weight. This may be used to calculate body mass index (BMI), which tells if you are at a healthy weight.  Heart rate and blood pressure.  Skin for abnormal spots. Counseling Your health care provider may ask you questions about your:  Alcohol, tobacco, and drug use.  Emotional well-being.  Home and relationship well-being.  Sexual activity.  Eating habits.  Work and work environment.  Method of birth control.  Menstrual cycle.  Pregnancy history. What immunizations do I need?  Influenza (flu) vaccine  This is recommended every year. Tetanus, diphtheria, and pertussis (Tdap) vaccine  You may need a Td booster every 10 years. Varicella (chickenpox) vaccine  You may need this if you have not been vaccinated. Human papillomavirus (HPV) vaccine  If recommended by your health care provider, you may need three doses over 6 months. Measles, mumps, and rubella (MMR) vaccine  You may need at least one dose of MMR. You may also need a second dose. Meningococcal conjugate (MenACWY) vaccine  One dose is recommended if you are age 19-21 years and a first-year college student living in a residence hall, or if you have one of several medical conditions. You may also need additional booster doses. Pneumococcal conjugate (PCV13) vaccine  You may need  this if you have certain conditions and were not previously vaccinated. Pneumococcal polysaccharide (PPSV23) vaccine  You may need one or two doses if you smoke cigarettes or if you have certain conditions. Hepatitis A vaccine  You may need this if you have certain conditions or if you travel or work in places where you may be exposed to hepatitis A. Hepatitis B vaccine  You may need this if you have certain conditions or if you travel or work in places where you may be exposed to hepatitis B. Haemophilus influenzae type b (Hib) vaccine  You may need this if you have certain conditions. You may receive vaccines as individual doses or as more than one vaccine together in one shot (combination vaccines). Talk with your health care provider about the risks and benefits of combination vaccines. What tests do I need?  Blood tests  Lipid and cholesterol levels. These may be checked every 5 years starting at age 20.  Hepatitis C test.  Hepatitis B test. Screening  Diabetes screening. This is done by checking your blood sugar (glucose) after you have not eaten for a while (fasting).  Sexually transmitted disease (STD) testing.  BRCA-related cancer screening. This may be done if you have a family history of breast, ovarian, tubal, or peritoneal cancers.  Pelvic exam and Pap test. This may be done every 3 years starting at age 21. Starting at age 30, this may be done every 5 years if you have a Pap test in combination with an HPV test. Talk with your health care provider about your test results, treatment options, and if necessary, the need for more tests.   Follow these instructions at home: °Eating and drinking ° °· Eat a diet that includes fresh fruits and vegetables, whole grains, lean protein, and low-fat dairy. °· Take vitamin and mineral supplements as recommended by your health care provider. °· Do not drink alcohol if: °? Your health care provider tells you not to drink. °? You are  pregnant, may be pregnant, or are planning to become pregnant. °· If you drink alcohol: °? Limit how much you have to 0-1 drink a day. °? Be aware of how much alcohol is in your drink. In the [place] one drink equals one 12 oz bottle of beer (355 mL), one 5 oz glass of wine (148 mL), or one 1½ oz glass of hard liquor (44 mL). °Lifestyle °· Take daily care of your teeth and gums. °· Stay active. Exercise for at least 30 minutes on 5 or more days each week. °· Do not use any products that contain nicotine or tobacco, such as cigarettes, e-cigarettes, and chewing tobacco. If you need help quitting, ask your health care provider. °· If you are sexually active, practice safe sex. Use a condom or other form of birth control (contraception) in order to prevent pregnancy and STIs (sexually transmitted infections). If you plan to become pregnant, see your health care provider for a preconception visit. °What's next? °· Visit your health care provider once a year for a well check visit. °· Ask your health care provider how often you should have your eyes and teeth checked. °· Stay up to date on all vaccines. °This information is not intended to replace advice given to you by your health care provider. Make sure you discuss any questions you have with your health care provider. °Document Released: 09/01/2001 Document Revised: 03/17/2018 Document Reviewed: 03/17/2018 °Elsevier Patient Education © 2020 [employer] °Breast Self-Awareness °Breast self-awareness is knowing how your breasts look and feel. Doing breast self-awareness is important. It allows you to catch a breast problem early while it is still small and can be treated. All women should do breast self-awareness, including women who have had breast implants. Tell your doctor if you notice a change in your breasts. °What you need: °· A mirror. °· A well-lit room. °How to do a breast self-exam °A breast self-exam is one way to learn what is normal for your breasts and to  check for changes. To do a breast self-exam: °Look for changes ° °1. Take off all the clothes above your waist. °2. Stand in front of a mirror in a room with good lighting. °3. Put your hands on your hips. °4. Push your hands down. °5. Look at your breasts and nipples in the mirror to see if one breast or nipple looks different from the other. Check to see if: °? The shape of one breast is different. °? The size of one breast is different. °? There are wrinkles, dips, and bumps in one breast and not the other. °6. Look at each breast for changes in the skin, such as: °? Redness. °? Scaly areas. °7. Look for changes in your nipples, such as: °? Liquid around the nipples. °? Bleeding. °? Dimpling. °? Redness. °? A change in where the nipples are. °Feel for changes ° °1. Lie on your back on the floor. °2. Feel each breast. To do this, follow these steps: °? Pick a breast to feel. °? Put the arm closest to that breast above your head. °? Use your other arm to feel the nipple area of   your breast. Feel the area with the pads of your three middle fingers by making small circles with your fingers. For the first circle, press lightly. For the second circle, press harder. For the third circle, press even harder. °? Keep making circles with your fingers at the different pressures as you move down your breast. Stop when you feel your ribs. °? Move your fingers a little toward the center of your body. °? Start making circles with your fingers again, this time going up until you reach your collarbone. °? Keep making up-and-down circles until you reach your armpit. Remember to keep using the three pressures. °? Feel the other breast in the same way. °3. Sit or stand in the tub or shower. °4. With soapy water on your skin, feel each breast the same way you did in step 2 when you were lying on the floor. °Write down what you find °Writing down what you find can help you remember what to tell your doctor. Write down: °· What is  normal for each breast. °· Any changes you find in each breast, including: °? The kind of changes you find. °? Whether you have pain. °? Size and location of any lumps. °· When you last had your menstrual period. °General tips °· Check your breasts every month. °· If you are breastfeeding, the best time to check your breasts is after you feed your baby or after you use a breast pump. °· If you get menstrual periods, the best time to check your breasts is 5-7 days after your menstrual period is over. °· With time, you will become comfortable with the self-exam, and you will begin to know if there are changes in your breasts. °Contact a doctor if you: °· See a change in the shape or size of your breasts or nipples. °· See a change in the skin of your breast or nipples, such as red or scaly skin. °· Have fluid coming from your nipples that is not normal. °· Find a lump or thick area that was not there before. °· Have pain in your breasts. °· Have any concerns about your breast health. °Summary °· Breast self-awareness includes looking for changes in your breasts, as well as feeling for changes within your breasts. °· Breast self-awareness should be done in front of a mirror in a well-lit room. °· You should check your breasts every month. If you get menstrual periods, the best time to check your breasts is 5-7 days after your menstrual period is over. °· Let your doctor know of any changes you see in your breasts, including changes in size, changes on the skin, pain or tenderness, or fluid from your nipples that is not normal. °This information is not intended to replace advice given to you by your health care provider. Make sure you discuss any questions you have with your health care provider. °Document Released: 12/23/2007 Document Revised: 02/22/2018 Document Reviewed: 02/22/2018 °Elsevier Patient Education © 2020 [employer] Inc[employer] ° °

## 2019-02-10 NOTE — Progress Notes (Signed)
Pt present for annual exam. Pt stated that she was doing well no problems.  

## 2019-02-11 LAB — COMPREHENSIVE METABOLIC PANEL
ALT: 17 IU/L (ref 0–32)
AST: 17 IU/L (ref 0–40)
Albumin/Globulin Ratio: 1.6 (ref 1.2–2.2)
Albumin: 4.6 g/dL (ref 3.8–4.8)
Alkaline Phosphatase: 83 IU/L (ref 39–117)
BUN/Creatinine Ratio: 12 (ref 9–23)
BUN: 8 mg/dL (ref 6–20)
Bilirubin Total: 0.3 mg/dL (ref 0.0–1.2)
CO2: 22 mmol/L (ref 20–29)
Calcium: 9.5 mg/dL (ref 8.7–10.2)
Chloride: 100 mmol/L (ref 96–106)
Creatinine, Ser: 0.68 mg/dL (ref 0.57–1.00)
GFR calc Af Amer: 134 mL/min/{1.73_m2} (ref >59)
GFR calc non Af Amer: 116 mL/min/{1.73_m2} (ref >59)
Globulin, Total: 2.8 g/dL (ref 1.5–4.5)
Glucose: 88 mg/dL (ref 65–99)
Potassium: 4.5 mmol/L (ref 3.5–5.2)
Sodium: 138 mmol/L (ref 134–144)
Total Protein: 7.4 g/dL (ref 6.0–8.5)

## 2019-02-11 LAB — CBC
Hematocrit: 40.9 % (ref 34.0–46.6)
Hemoglobin: 13.6 g/dL (ref 11.1–15.9)
MCH: 27.6 pg (ref 26.6–33.0)
MCHC: 33.3 g/dL (ref 31.5–35.7)
MCV: 83 fL (ref 79–97)
Platelets: 319 10*3/uL (ref 150–450)
RBC: 4.93 x10E6/uL (ref 3.77–5.28)
RDW: 12.6 % (ref 11.7–15.4)
WBC: 13.3 10*3/uL — ABNORMAL HIGH (ref 3.4–10.8)

## 2020-01-02 ENCOUNTER — Other Ambulatory Visit: Payer: Self-pay | Admitting: Internal Medicine

## 2020-03-01 ENCOUNTER — Ambulatory Visit: Payer: No Typology Code available for payment source | Admitting: Obstetrics and Gynecology

## 2020-03-01 ENCOUNTER — Other Ambulatory Visit: Payer: Self-pay

## 2020-03-01 ENCOUNTER — Encounter: Payer: Self-pay | Admitting: Obstetrics and Gynecology

## 2020-03-01 VITALS — BP 119/80 | HR 84 | Ht 62.0 in | Wt 158.3 lb

## 2020-03-01 DIAGNOSIS — E282 Polycystic ovarian syndrome: Secondary | ICD-10-CM

## 2020-03-01 DIAGNOSIS — N926 Irregular menstruation, unspecified: Secondary | ICD-10-CM | POA: Diagnosis not present

## 2020-03-01 DIAGNOSIS — O200 Threatened abortion: Secondary | ICD-10-CM

## 2020-03-01 LAB — POCT URINE PREGNANCY: Preg Test, Ur: POSITIVE — AB

## 2020-03-01 NOTE — Progress Notes (Signed)
PT is present today for confirmation of pregnancy. Pt LMP 01/21/2020. UPT done today results were positive with a fade T line.  Pt sent a mychart message this morning stating that she has noticed some bleeding and cramping during the night.

## 2020-03-01 NOTE — Patient Instructions (Signed)
Commonly Asked Questions During Pregnancy  Cats: A parasite can be excreted in cat feces.  To avoid exposure you need to have another person empty the little box.  If you must empty the litter box you will need to wear gloves.  Wash your hands after handling your cat.  This parasite can also be found in raw or undercooked meat so this should also be avoided.  Colds, Sore Throats, Flu: Please check your medication sheet to see what you can take for symptoms.  If your symptoms are unrelieved by these medications please call the office.  Dental Work: Most any dental work your dentist recommends is permitted.  X-rays should only be taken during the first trimester if absolutely necessary.  Your abdomen should be shielded with a lead apron during all x-rays.  Please notify your provider prior to receiving any x-rays.  Novocaine is fine; gas is not recommended.  If your dentist requires a note from us prior to dental work please call the office and we will provide one for you.  Exercise: Exercise is an important part of staying healthy during your pregnancy.  You may continue most exercises you were accustomed to prior to pregnancy.  Later in your pregnancy you will most likely notice you have difficulty with activities requiring balance like riding a bicycle.  It is important that you listen to your body and avoid activities that put you at a higher risk of falling.  Adequate rest and staying well hydrated are a must!  If you have questions about the safety of specific activities ask your provider.    Exposure to Children with illness: Try to avoid obvious exposure; report any symptoms to us when noted,  If you have chicken pos, red measles or mumps, you should be immune to these diseases.   Please do not take any vaccines while pregnant unless you have checked with your OB provider.  Fetal Movement: After 28 weeks we recommend you do "kick counts" twice daily.  Lie or sit down in a calm quiet environment and  count your baby movements "kicks".  You should feel your baby at least 10 times per hour.  If you have not felt 10 kicks within the first hour get up, walk around and have something sweet to eat or drink then repeat for an additional hour.  If count remains less than 10 per hour notify your provider.  Fumigating: Follow your pest control agent's advice as to how long to stay out of your home.  Ventilate the area well before re-entering.  Hemorrhoids:   Most over-the-counter preparations can be used during pregnancy.  Check your medication to see what is safe to use.  It is important to use a stool softener or fiber in your diet and to drink lots of liquids.  If hemorrhoids seem to be getting worse please call the office.   Hot Tubs:  Hot tubs Jacuzzis and saunas are not recommended while pregnant.  These increase your internal body temperature and should be avoided.  Intercourse:  Sexual intercourse is safe during pregnancy as long as you are comfortable, unless otherwise advised by your provider.  Spotting may occur after intercourse; report any bright red bleeding that is heavier than spotting.  Labor:  If you know that you are in labor, please go to the hospital.  If you are unsure, please call the office and let us help you decide what to do.  Lifting, straining, etc:  If your job requires heavy   lifting or straining please check with your provider for any limitations.  Generally, you should not lift items heavier than that you can lift simply with your hands and arms (no back muscles)  Painting:  Paint fumes do not harm your pregnancy, but may make you ill and should be avoided if possible.  Latex or water based paints have less odor than oils.  Use adequate ventilation while painting.  Permanents & Hair Color:  Chemicals in hair dyes are not recommended as they cause increase hair dryness which can increase hair loss during pregnancy.  " Highlighting" and permanents are allowed.  Dye may be  absorbed differently and permanents may not hold as well during pregnancy.  Sunbathing:  Use a sunscreen, as skin burns easily during pregnancy.  Drink plenty of fluids; avoid over heating.  Tanning Beds:  Because their possible side effects are still unknown, tanning beds are not recommended.  Ultrasound Scans:  Routine ultrasounds are performed at approximately 20 weeks.  You will be able to see your baby's general anatomy an if you would like to know the gender this can usually be determined as well.  If it is questionable when you conceived you may also receive an ultrasound early in your pregnancy for dating purposes.  Otherwise ultrasound exams are not routinely performed unless there is a medical necessity.  Although you can request a scan we ask that you pay for it when conducted because insurance does not cover " patient request" scans.  Work: If your pregnancy proceeds without complications you may work until your due date, unless your physician or employer advises otherwise.  Round Ligament Pain/Pelvic Discomfort:  Sharp, shooting pains not associated with bleeding are fairly common, usually occurring in the second trimester of pregnancy.  They tend to be worse when standing up or when you remain standing for long periods of time.  These are the result of pressure of certain pelvic ligaments called "round ligaments".  Rest, Tylenol and heat seem to be the most effective relief.  As the womb and fetus grow, they rise out of the pelvis and the discomfort improves.  Please notify the office if your pain seems different than that described.  It may represent a more serious condition.  Common Medications Safe in Pregnancy  Acne:      Constipation:  Benzoyl Peroxide     Colace  Clindamycin      Dulcolax Suppository  Topica Erythromycin     Fibercon  Salicylic Acid      Metamucil         Miralax AVOID:        Senakot   Accutane    Cough:  Retin-A       Cough  Drops  Tetracycline      Phenergan w/ Codeine if Rx  Minocycline      Robitussin (Plain & DM)  Antibiotics:     Crabs/Lice:  Ceclor       RID  Cephalosporins    AVOID:  E-Mycins      Kwell  Keflex  Macrobid/Macrodantin   Diarrhea:  Penicillin      Kao-Pectate  Zithromax      Imodium AD         PUSH FLUIDS AVOID:       Cipro     Fever:  Tetracycline      Tylenol (Regular or Extra  Minocycline       Strength)  Levaquin      Extra Strength-Do not  Exceed 8 tabs/24 hrs Caffeine:        <200mg/day (equiv. To 1 cup of coffee or  approx. 3 12 oz sodas)         Gas: Cold/Hayfever:       Gas-X  Benadryl      Mylicon  Claritin       Phazyme  **Claritin-D        Chlor-Trimeton    Headaches:  Dimetapp      ASA-Free Excedrin  Drixoral-Non-Drowsy     Cold Compress  Mucinex (Guaifenasin)     Tylenol (Regular or Extra  Sudafed/Sudafed-12 Hour     Strength)  **Sudafed PE Pseudoephedrine   Tylenol Cold & Sinus     Vicks Vapor Rub  Zyrtec  **AVOID if Problems With Blood Pressure         Heartburn: Avoid lying down for at least 1 hour after meals  Aciphex      Maalox     Rash:  Milk of Magnesia     Benadryl    Mylanta       1% Hydrocortisone Cream  Pepcid  Pepcid Complete   Sleep Aids:  Prevacid      Ambien   Prilosec       Benadryl  Rolaids       Chamomile Tea  Tums (Limit 4/day)     Unisom         Tylenol PM         Warm milk-add vanilla or  Hemorrhoids:       Sugar for taste  Anusol/Anusol H.C.  (RX: Analapram 2.5%)  Sugar Substitutes:  Hydrocortisone OTC     Ok in moderation  Preparation H      Tucks        Vaseline lotion applied to tissue with wiping    Herpes:     Throat:  Acyclovir      Oragel  Famvir  Valtrex     Vaccines:         Flu Shot Leg Cramps:       *Gardasil  Benadryl      Hepatitis A         Hepatitis B Nasal Spray:       Pneumovax  Saline Nasal Spray     Polio Booster         Tetanus Nausea:       Tuberculosis test or PPD  Vitamin  B6 25 mg TID   AVOID:    Dramamine      *Gardasil  Emetrol       Live Poliovirus  Ginger Root 250 mg QID    MMR (measles, mumps &  High Complex Carbs @ Bedtime    rebella)  Sea Bands-Accupressure    Varicella (Chickenpox)  Unisom 1/2 tab TID     *No known complications           If received before Pain:         Known pregnancy;   Darvocet       Resume series after  Lortab        Delivery  Percocet    Yeast:   Tramadol      Femstat  Tylenol 3      Gyne-lotrimin  Ultram       Monistat  Vicodin           MISC:         All Sunscreens             Hair Coloring/highlights          Insect Repellant's          (Including DEET)         Mystic Tans   Vaginal Bleeding During Pregnancy, First Trimester  A small amount of bleeding (spotting) from the vagina is common during early pregnancy. Sometimes the bleeding is normal and does not cause problems. At other times, though, bleeding may be a sign of something serious. Tell your doctor about any bleeding from your vagina right away. Follow these instructions at home: Activity  Follow your doctor's instructions about how active you can be.  If needed, make plans for someone to help with your normal activities.  Do not have sex or orgasms until your doctor says that this is safe. General instructions  Take over-the-counter and prescription medicines only as told by your doctor.  Watch your condition for any changes.  Write down: ? The number of pads you use each day. ? How often you change pads. ? How soaked (saturated) your pads are.  Do not use tampons.  Do not douche.  If you pass any tissue from your vagina, save it to show to your doctor.  Keep all follow-up visits as told by your doctor. This is important. Contact a doctor if:  You have vaginal bleeding at any time while you are pregnant.  You have cramps.  You have a fever. Get help right away if:  You have very bad cramps in your back or belly (abdomen).  You  pass large clots or a lot of tissue from your vagina.  Your bleeding gets worse.  You feel light-headed.  You feel weak.  You pass out (faint).  You have chills.  You are leaking fluid from your vagina.  You have a gush of fluid from your vagina. Summary  Sometimes vaginal bleeding during pregnancy is normal and does not cause problems. At other times, bleeding may be a sign of something serious.  Tell your doctor about any bleeding from your vagina right away.  Follow your doctor's instructions about how active you can be. You may need someone to help you with your normal activities. This information is not intended to replace advice given to you by your health care provider. Make sure you discuss any questions you have with your health care provider. Document Revised: 10/25/2018 Document Reviewed: 10/07/2016 Elsevier Patient Education  [place].

## 2020-03-01 NOTE — Progress Notes (Signed)
   GYNECOLOGY CLINIC PROGRESS NOTE  Subjective:    ARLYCE CIRCLE is a 34 y.o. G27P0010 who female who presents for evaluation of amenorrhea. She believes she could be pregnant. She has a h/o PCOS. Pregnancy is desired. Sexual Activity: single partner, contraception: none. Current symptoms also include: breast tenderness, fatigue, positive home pregnancy test and just yesterday started noted vaginal spotting which has now increased to light bleeding and cramping this morning. Last period was normal.  Patient's last menstrual period was 01/21/2020.    The following portions of the patient's history were reviewed and updated as appropriate: allergies, current medications, past family history, past medical history, past social history, past surgical history and problem list.  Review of Systems Pertinent items noted in HPI and remainder of comprehensive ROS otherwise negative.     Objective:    BP 119/80   Pulse 84   Ht 5\' 2"  (1.575 m)   Wt 158 lb 4.8 oz (71.8 kg)   LMP 01/21/2020   BMI 28.95 kg/m  General: alert, no distress and no acute distress   Abdomen: Soft, non-tender, no organomegaly  Pelvis:  external genitalia normal, rectovaginal septum normal.  Vagina without discharge, but small dark red blood in vagina.  Cervix normal appearing, no lesions, closed.  Uterus mobile, nontender, normal shape and size.  Adnexae non-palpable, nontender bilaterally.     Lab Review Urine HCG: positive (faintly)   Assessment:  Absent menses Threatened miscarriage  PCOS  Plan:   - Pregnancy Test: Positive: EDC: 10/27/2020, with EGA 5.5 weeks.  Discussed SAB precautions, advised on follow up in the Emergency Room over the weekend if bleeding becomes heavier (greater than 1 pad every 1-2 hrs). Will check BHCG levels today and repeat on Monday. Discussed causes of bleeding in first trimester. - Briefly discussed pre-natal care options.  Encouraged well-balanced diet, plenty of rest when needed,  pre-natal vitamins daily and walking for exercise. Discussed self-help for nausea, avoiding OTC medications until consulting provider or pharmacist, other than Tylenol as needed, minimal caffeine (1-2 cups daily) and avoiding alcohol. She will schedule her initial OB visit in the next month with her PCP or OB provider. Feel free to call with any questions.    Wednesday, MD [facility]

## 2020-03-02 LAB — BETA HCG QUANT (REF LAB): hCG Quant: 154 m[IU]/mL

## 2020-03-04 ENCOUNTER — Other Ambulatory Visit: Payer: Self-pay

## 2020-03-04 ENCOUNTER — Other Ambulatory Visit: Payer: No Typology Code available for payment source

## 2020-03-04 DIAGNOSIS — O200 Threatened abortion: Secondary | ICD-10-CM

## 2020-03-05 LAB — BETA HCG QUANT (REF LAB): hCG Quant: 12 m[IU]/mL

## 2020-04-17 ENCOUNTER — Encounter: Payer: Self-pay | Admitting: Certified Nurse Midwife

## 2020-04-17 ENCOUNTER — Ambulatory Visit: Payer: No Typology Code available for payment source | Admitting: Certified Nurse Midwife

## 2020-04-17 ENCOUNTER — Other Ambulatory Visit: Payer: Self-pay

## 2020-04-17 VITALS — BP 111/73 | HR 73 | Ht 62.0 in | Wt 158.7 lb

## 2020-04-17 DIAGNOSIS — N926 Irregular menstruation, unspecified: Secondary | ICD-10-CM

## 2020-04-17 DIAGNOSIS — O200 Threatened abortion: Secondary | ICD-10-CM

## 2020-04-17 LAB — POCT URINE PREGNANCY: Preg Test, Ur: POSITIVE — AB

## 2020-04-17 NOTE — Patient Instructions (Signed)
Miscarriage A miscarriage is the loss of an unborn baby (fetus) before the 20th week of pregnancy. Follow these instructions at home: Medicines   Take over-the-counter and prescription medicines only as told by your doctor.  If you were prescribed antibiotic medicine, take it as told by your doctor. Do not stop taking the antibiotic even if you start to feel better.  Do not take NSAIDs unless your doctor says that this is safe for you. NSAIDs include aspirin and ibuprofen. These medicines can cause bleeding. Activity  Rest as directed. Ask your doctor what activities are safe for you.  Have someone help you at home during this time. General instructions  Write down how many pads you use each day and how soaked they are.  Watch the amount of tissue or clumps of blood (blood clots) that you pass from your vagina. Save any large amounts of tissue for your doctor.  Do not use tampons, douche, or have sex until your doctor approves.  To help you and your partner with the process of grieving, talk with your doctor or seek counseling.  When you are ready, meet with your doctor to talk about steps you should take for your health. Also, talk with your doctor about steps to take to have a healthy pregnancy in the future.  Keep all follow-up visits as told by your doctor. This is important. Contact a doctor if:  You have a fever or chills.  You have vaginal discharge that smells bad.  You have more bleeding. Get help right away if:  You have very bad cramps or pain in your back or belly.  You pass clumps of blood that are walnut-sized or larger from your vagina.  You pass tissue that is walnut-sized or larger from your vagina.  You soak more than 1 regular pad in an hour.  You get light-headed or weak.  You faint (pass out).  You have feelings of sadness that do not go away, or you have thoughts of hurting yourself. Summary  A miscarriage is the loss of an unborn baby before  the 20th week of pregnancy.  Follow your doctor's instructions for home care. Keep all follow-up appointments.  To help you and your partner with the process of grieving, talk with your doctor or seek counseling. This information is not intended to replace advice given to you by your health care provider. Make sure you discuss any questions you have with your health care provider. Document Revised: 10/28/2018 Document Reviewed: 08/11/2016 Elsevier Patient Education  2020 [employer]  

## 2020-04-17 NOTE — Progress Notes (Signed)
Subjective:    Melinda Wells is a 34 y.o. female who presents for evaluation of possible miscarriage She had a miscarriage in August. Has had a positive home pregnancy test and is now having some bleeding. She is concerned that she may be having another miscarriage. . She believes she could be pregnant. Pregnancy is not desired. Sexual Activity: single partner, contraception: none.   Patient's last menstrual period was 02/28/2020. The following portions of the patient's history were reviewed and updated as appropriate: allergies, current medications, past family history, past medical history, past social history, past surgical history and problem list.  Review of Systems Pertinent items are noted in HPI.     Objective:    BP 111/73   Pulse 73   Ht 5\' 2"  (1.575 m)   Wt 158 lb 11.2 oz (72 kg)   LMP 02/28/2020   BMI 29.03 kg/m  General: alert, cooperative, appears stated age, mild distress and no acute distress    Lab Review Urine HCG: positive    Assessment:    Absence of menstruation.     Plan:    Beta hcg collected today.Will repeat on Monday. Discussed u/s if/when HCG level appropriate. Discussed signs and symptoms of miscarriage and implantation bleeding and cramping. Will follow up with lab results.   Saturday, CNM

## 2020-04-18 LAB — BETA HCG QUANT (REF LAB): hCG Quant: 18460 m[IU]/mL

## 2020-04-22 ENCOUNTER — Other Ambulatory Visit: Payer: No Typology Code available for payment source

## 2020-04-22 ENCOUNTER — Other Ambulatory Visit: Payer: Self-pay

## 2020-04-22 DIAGNOSIS — O200 Threatened abortion: Secondary | ICD-10-CM

## 2020-04-23 ENCOUNTER — Other Ambulatory Visit: Payer: Self-pay | Admitting: Certified Nurse Midwife

## 2020-04-23 DIAGNOSIS — O200 Threatened abortion: Secondary | ICD-10-CM

## 2020-04-23 LAB — BETA HCG QUANT (REF LAB): hCG Quant: 34967 m[IU]/mL

## 2020-04-30 ENCOUNTER — Telehealth: Payer: Self-pay

## 2020-04-30 NOTE — Telephone Encounter (Signed)
Called pt her appt was over lapping another u/s. LMTRC telling pt we move her appt to 11 and if that time doesn't work to please call and let us know. If not we will see her tomorrow

## 2020-05-01 ENCOUNTER — Ambulatory Visit (INDEPENDENT_AMBULATORY_CARE_PROVIDER_SITE_OTHER): Payer: No Typology Code available for payment source

## 2020-05-01 ENCOUNTER — Other Ambulatory Visit: Payer: Self-pay

## 2020-05-01 ENCOUNTER — Other Ambulatory Visit: Payer: No Typology Code available for payment source

## 2020-05-01 DIAGNOSIS — O200 Threatened abortion: Secondary | ICD-10-CM | POA: Diagnosis not present

## 2020-05-01 DIAGNOSIS — Z3A01 Less than 8 weeks gestation of pregnancy: Secondary | ICD-10-CM

## 2020-05-14 ENCOUNTER — Emergency Department: Payer: No Typology Code available for payment source

## 2020-05-14 ENCOUNTER — Emergency Department
Admission: EM | Admit: 2020-05-14 | Discharge: 2020-05-14 | Disposition: A | Discharge status: Home / Self Care | Payer: No Typology Code available for payment source | Attending: Emergency Medicine | Admitting: Emergency Medicine

## 2020-05-14 ENCOUNTER — Other Ambulatory Visit: Payer: Self-pay

## 2020-05-14 DIAGNOSIS — O26891 Other specified pregnancy related conditions, first trimester: Secondary | ICD-10-CM | POA: Diagnosis present

## 2020-05-14 DIAGNOSIS — M25562 Pain in left knee: Secondary | ICD-10-CM | POA: Diagnosis not present

## 2020-05-14 DIAGNOSIS — R1030 Lower abdominal pain, unspecified: Secondary | ICD-10-CM | POA: Diagnosis not present

## 2020-05-14 DIAGNOSIS — Z3A09 9 weeks gestation of pregnancy: Secondary | ICD-10-CM | POA: Diagnosis not present

## 2020-05-14 DIAGNOSIS — R109 Unspecified abdominal pain: Secondary | ICD-10-CM

## 2020-05-14 LAB — CBC
HCT: 37.4 % (ref 36.0–46.0)
Hemoglobin: 12.6 g/dL (ref 12.0–15.0)
MCH: 28.6 pg (ref 26.0–34.0)
MCHC: 33.7 g/dL (ref 30.0–36.0)
MCV: 84.8 fL (ref 80.0–100.0)
Platelets: 293 10*3/uL (ref 150–400)
RBC: 4.41 MIL/uL (ref 3.87–5.11)
RDW: 13.2 % (ref 11.5–15.5)
WBC: 14.5 10*3/uL — ABNORMAL HIGH (ref 4.0–10.5)
nRBC: 0 % (ref 0.0–0.2)

## 2020-05-14 LAB — COMPREHENSIVE METABOLIC PANEL
ALT: 15 U/L (ref 0–44)
AST: 17 U/L (ref 15–41)
Albumin: 3.9 g/dL (ref 3.5–5.0)
Alkaline Phosphatase: 60 U/L (ref 38–126)
Anion gap: 10 (ref 5–15)
BUN: 11 mg/dL (ref 6–20)
CO2: 26 mmol/L (ref 22–32)
Calcium: 9.7 mg/dL (ref 8.9–10.3)
Chloride: 100 mmol/L (ref 98–111)
Creatinine, Ser: 0.5 mg/dL (ref 0.44–1.00)
GFR, Estimated: >60 mL/min (ref >60)
Glucose, Bld: 102 mg/dL — ABNORMAL HIGH (ref 70–99)
Potassium: 3.9 mmol/L (ref 3.5–5.1)
Sodium: 136 mmol/L (ref 135–145)
Total Bilirubin: 0.4 mg/dL (ref 0.3–1.2)
Total Protein: 7.6 g/dL (ref 6.5–8.1)

## 2020-05-14 LAB — URINALYSIS, COMPLETE (UACMP) WITH MICROSCOPIC
Bilirubin Urine: NEGATIVE
Glucose, UA: NEGATIVE mg/dL
Hgb urine dipstick: NEGATIVE
Ketones, ur: NEGATIVE mg/dL
Leukocytes,Ua: NEGATIVE
Nitrite: NEGATIVE
Protein, ur: NEGATIVE mg/dL
Specific Gravity, Urine: 1.003 — ABNORMAL LOW (ref 1.005–1.030)
pH: 7 (ref 5.0–8.0)

## 2020-05-14 LAB — HCG, QUANTITATIVE, PREGNANCY: hCG, Beta Chain, Quant, S: 172725 m[IU]/mL — ABNORMAL HIGH (ref <5)

## 2020-05-14 LAB — POCT PREGNANCY, URINE: Preg Test, Ur: POSITIVE — AB
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
## 2020-05-14 NOTE — Discharge Instructions (Signed)
Follow-up with your regular doctors as needed.  Return emergency department worsening.  Take Tylenol for pain as needed.

## 2020-05-16 ENCOUNTER — Other Ambulatory Visit: Payer: Self-pay

## 2020-05-16 ENCOUNTER — Ambulatory Visit: Payer: No Typology Code available for payment source

## 2020-05-16 VITALS — BP 103/65 | HR 76 | Wt 157.4 lb

## 2020-05-16 DIAGNOSIS — Z3A09 9 weeks gestation of pregnancy: Secondary | ICD-10-CM

## 2020-05-16 LAB — OB RESULTS CONSOLE VARICELLA ZOSTER ANTIBODY, IGG: Varicella: NON-IMMUNE/NOT IMMUNE

## 2020-05-16 NOTE — Progress Notes (Signed)
Purcell Mouton Ksiazek presents for NOB nurse interview visit. LMP 01/21/20 approximate. Pregnancy confirmation done 03/01/20 Doreene Burke CNM. Dating and viability U/S 05/14/20 9 w 4 d EDD 12/14/19. G-3 P 0 0 2 0.Pregnancy education material explained and given. 0 cats in the home. NOB labs ordered. HIV labs and Drug screen were explained optional and she did not decline. Drug screen ordered. PNV encouraged. Genetic screening options discussed. Genetic testing: Unsure.  Pt may discuss with provider. Pt. to follow up with provider in 4 weeks for NOB physical around 06/03/20.  All questions answered.  FMLA paperwork signed. Financial policy reviewed and understood.

## 2020-05-17 LAB — MICROSCOPIC EXAMINATION
Casts: NONE SEEN /lpf
Epithelial Cells (non renal): >10 /hpf — AB (ref 0–10)
RBC, Urine: NONE SEEN /hpf (ref 0–2)

## 2020-05-17 LAB — URINALYSIS, ROUTINE W REFLEX MICROSCOPIC
Bilirubin, UA: NEGATIVE
Glucose, UA: NEGATIVE
Ketones, UA: NEGATIVE
Nitrite, UA: NEGATIVE
Protein,UA: NEGATIVE
Specific Gravity, UA: 1.028 (ref 1.005–1.030)
Urobilinogen, Ur: 0.2 mg/dL (ref 0.2–1.0)
pH, UA: 5.5 (ref 5.0–7.5)

## 2020-05-17 LAB — ABO AND RH: Rh Factor: POSITIVE

## 2020-05-17 LAB — VARICELLA ZOSTER ANTIBODY, IGG: Varicella zoster IgG: 3870 index (ref >165)

## 2020-05-17 LAB — RPR: RPR Ser Ql: NONREACTIVE

## 2020-05-17 LAB — HIV ANTIBODY (ROUTINE TESTING W REFLEX): HIV Screen 4th Generation wRfx: NONREACTIVE

## 2020-05-17 LAB — HEPATITIS B SURFACE ANTIGEN: Hepatitis B Surface Ag: NEGATIVE

## 2020-05-17 LAB — RUBELLA SCREEN: Rubella Antibodies, IGG: 2.21 index (ref >0.99)

## 2020-05-17 LAB — ANTIBODY SCREEN: Antibody Screen: NEGATIVE

## 2020-05-19 LAB — MONITOR DRUG PROFILE 14(MW)
Amphetamine Scrn, Ur: NEGATIVE ng/mL
BARBITURATE SCREEN URINE: NEGATIVE ng/mL
BENZODIAZEPINE SCREEN, URINE: NEGATIVE ng/mL
Buprenorphine, Urine: NEGATIVE ng/mL
CANNABINOIDS UR QL SCN: NEGATIVE ng/mL
Cocaine (Metab) Scrn, Ur: NEGATIVE ng/mL
Creatinine(Crt), U: 243.9 mg/dL (ref 20.0–300.0)
Fentanyl, Urine: NEGATIVE pg/mL
Meperidine Screen, Urine: NEGATIVE ng/mL
Methadone Screen, Urine: NEGATIVE ng/mL
OXYCODONE+OXYMORPHONE UR QL SCN: NEGATIVE ng/mL
Opiate Scrn, Ur: NEGATIVE ng/mL
Ph of Urine: 6.1 (ref 4.5–8.9)
Phencyclidine Qn, Ur: NEGATIVE ng/mL
Propoxyphene Scrn, Ur: NEGATIVE ng/mL
SPECIFIC GRAVITY: 1.021
Tramadol Screen, Urine: NEGATIVE ng/mL

## 2020-05-21 LAB — GC/CHLAMYDIA PROBE AMP
Chlamydia trachomatis, NAA: NEGATIVE
Neisseria Gonorrhoeae by PCR: NEGATIVE

## 2020-05-23 LAB — URINE CULTURE

## 2020-05-27 ENCOUNTER — Other Ambulatory Visit: Payer: Self-pay | Admitting: Certified Nurse Midwife

## 2020-05-27 MED ORDER — NITROFURANTOIN MONOHYD MACRO 100 MG PO CAPS: 100.0000 mg | ORAL_CAPSULE | Freq: Two times a day (BID) | ORAL | 0 refills | Status: DC

## 2020-05-27 NOTE — Progress Notes (Signed)
Urine culture positive for UTI, orders place for treatment. Pt notified via my chart.   Doreene Burke, CNM

## 2020-06-03 ENCOUNTER — Other Ambulatory Visit: Payer: Self-pay

## 2020-06-03 ENCOUNTER — Encounter: Payer: Self-pay | Admitting: Certified Nurse Midwife

## 2020-06-03 ENCOUNTER — Ambulatory Visit (INDEPENDENT_AMBULATORY_CARE_PROVIDER_SITE_OTHER): Payer: No Typology Code available for payment source | Admitting: Certified Nurse Midwife

## 2020-06-03 VITALS — BP 100/62 | HR 91 | Wt 157.2 lb

## 2020-06-03 DIAGNOSIS — Z3A12 12 weeks gestation of pregnancy: Secondary | ICD-10-CM | POA: Diagnosis not present

## 2020-06-03 LAB — POCT URINALYSIS DIPSTICK OB
Bilirubin, UA: NEGATIVE
Glucose, UA: NEGATIVE
Leukocytes, UA: NEGATIVE
Nitrite, UA: NEGATIVE
POC,PROTEIN,UA: NEGATIVE
Spec Grav, UA: 1.025 (ref 1.010–1.025)
Urobilinogen, UA: 0.2 E.U./dL
pH, UA: 5 (ref 5.0–8.0)

## 2020-06-03 NOTE — Patient Instructions (Signed)
Pregnancy and Urinary Tract Infection ° °A urinary tract infection (UTI) is an infection of any part of the urinary tract. This includes the kidneys, the tubes that connect your kidneys to your bladder (ureters), the bladder, and the tube that carries urine out of your body (urethra). These organs make, store, and get rid of urine in the body. Your health care provider may use other names to describe the infection. An upper UTI affects the ureters and kidneys (pyelonephritis). A lower UTI affects the bladder (cystitis) and urethra (urethritis). °Most urinary tract infections are caused by bacteria in your genital area, around the entrance to your urinary tract (urethra). These bacteria grow and cause irritation and inflammation of your urinary tract. You are more likely to develop a UTI during pregnancy because the physical and hormonal changes your body goes through can make it easier for bacteria to get into your urinary tract. Your growing baby also puts pressure on your bladder and can affect urine flow. It is important to recognize and treat UTIs in pregnancy because of the risk of serious complications for both you and your baby. °How does this affect me? °Symptoms of a UTI include: °· Needing to urinate right away (urgently). °· Frequent urination or passing small amounts of urine frequently. °· Pain or burning with urination. °· Blood in the urine. °· Urine that smells bad or unusual. °· Trouble urinating. °· Cloudy urine. °· Pain in the abdomen or lower back. °· Vaginal discharge. °You may also have: °· Vomiting or a decreased appetite. °· Confusion. °· Irritability or tiredness. °· A fever. °· Diarrhea. °How does this affect my baby? °An untreated UTI during pregnancy could lead to a kidney infection or a systemic infection, which can cause health problems that could affect your baby. Possible complications of an untreated UTI include: °· Giving birth to your baby before 37 weeks of pregnancy  (premature). °· Having a baby with a low birth weight. °· Developing high blood pressure during pregnancy (preeclampsia). °· Having a low hemoglobin level (anemia). °What can I do to lower my risk? °To prevent a UTI: °· Go to the bathroom as soon as you feel the need. Do not hold urine for long periods of time. °· Always wipe from front to back, especially after a bowel movement. Use each tissue one time when you wipe. °· Empty your bladder after sex. °· Keep your genital area dry. °· Drink 6-10 glasses of water each day. °· Do not douche or use deodorant sprays. °How is this treated? °Treatment for this condition may include: °· Antibiotic medicines that are safe to take during pregnancy. °· Other medicines to treat less common causes of UTI. °Follow these instructions at home: °· If you were prescribed an antibiotic medicine, take it as told by your health care provider. Do not stop using the antibiotic even if you start to feel better. °· Keep all follow-up visits as told by your health care provider. This is important. °Contact a health care provider if: °· Your symptoms do not improve or they get worse. °· You have abnormal vaginal discharge. °Get help right away if you: °· Have a fever. °· Have nausea and vomiting. °· Have back or side pain. °· Feel contractions in your uterus. °· Have lower belly pain. °· Have a gush of fluid from your vagina. °· Have blood in your urine. °Summary °· A urinary tract infection (UTI) is an infection of any part of the urinary tract, which includes the   kidneys, ureters, bladder, and urethra. °· Most urinary tract infections are caused by bacteria in your genital area, around the entrance to your urinary tract (urethra). °· You are more likely to develop a UTI during pregnancy. °· If you were prescribed an antibiotic medicine, take it as told by your health care provider. Do not stop using the antibiotic even if you start to feel better. °This information is not intended to  replace advice given to you by your health care provider. Make sure you discuss any questions you have with your health care provider. °Document Revised: 10/28/2018 Document Reviewed: 06/09/2018 °Elsevier Patient Education © 2020 Else[employer] Inc[employer] ° °

## 2020-06-03 NOTE — Progress Notes (Signed)
NEW OB HISTORY AND PHYSICAL  SUBJECTIVE:       Melinda Wells is a 34 y.o. G64P0020 female, Patient's last menstrual period was 01/21/2020 (approximate)., Estimated Date of Delivery: 12/13/20, [redacted]w[redacted]d, presents today for establishment of Prenatal Care. She has no unusual complaints.  Social: Relationship: Married Living with her spouse ( his 2 children may move in with them).  Work: Visual merchandiser Exercise: used to prior to pregnancy,  Smoke: no Alcohol: occasional prior to pregnancy  Drug use: none   Gynecologic History Patient's last menstrual period was 01/21/2020 (approximate). Normal Contraception: none Last Pap: 10/02/2017. Results were: normal/HPV neg.  Obstetric History OB History  Gravida Para Term Preterm AB Living  3       2    SAB TAB Ectopic Multiple Live Births  1            # Outcome Date GA Lbr Len/2nd Weight Sex Delivery Anes PTL Lv  3 Current           2 SAB 2021          1 AB 2013            Past Medical History:  Diagnosis Date   Degeneration of cervical intervertebral disc    PCOS (polycystic ovarian syndrome)     Past Surgical History:  Procedure Laterality Date   NO PAST SURGERIES      Current Outpatient Medications on File Prior to Visit  Medication Sig Dispense Refill   nitrofurantoin, macrocrystal-monohydrate, (MACROBID) 100 MG capsule Take 1 capsule (100 mg total) by mouth 2 (two) times daily for 7 days. 14 capsule 0   Prenatal Vit-Fe Fumarate-FA (MULTIVITAMIN-PRENATAL) 27-0.8 MG TABS tablet Take 1 tablet by mouth daily at 12 noon.     Vitamin D, Ergocalciferol, (DRISDOL) 50000 units CAPS capsule   5   No current facility-administered medications on file prior to visit.    No Known Allergies  Social History   Socioeconomic History   Marital status: Married    Spouse name: Not on file   Number of children: Not on file   Years of education: Not on file   Highest education level: Not on file  Occupational History   Not  on file  Tobacco Use   Smoking status: Never Smoker   Smokeless tobacco: Never Used  Vaping Use   Vaping Use: Never used  Substance and Sexual Activity   Alcohol use: Not Currently    Comment: Occasionally    Drug use: No   Sexual activity: Yes    Birth control/protection: None  Other Topics Concern   Not on file  Social History Narrative   Not on file   Social Determinants of Health   Financial Resource Strain:    Difficulty of Paying Living Expenses: Not on file  Food Insecurity:    Worried About Running Out of Food in the Last Year: Not on file   [employer] of Food in the Last Year: Not on file  Transportation Needs:    Lack of Transportation (Medical): Not on file   Lack of Transportation (Non-Medical): Not on file  Physical Activity:    Days of Exercise per Week: Not on file   Minutes of Exercise per Session: Not on file  Stress:    Feeling of Stress : Not on file  Social Connections:    Frequency of Communication with Friends and Family: Not on file   Frequency of Social Gatherings with Friends and Family: Not  on file   Attends Religious Services: Not on file   Active Member of Clubs or Organizations: Not on file   Attends Banker Meetings: Not on file   Marital Status: Not on file  Intimate Partner Violence:    Fear of Current or Ex-Partner: Not on file   Emotionally Abused: Not on file   Physically Abused: Not on file   Sexually Abused: Not on file    Family History  Problem Relation Age of Onset   Hyperlipidemia Mother    Diabetes Maternal Grandfather     The following portions of the patient's history were reviewed and updated as appropriate: allergies, current medications, past OB history, past medical history, past surgical history, past family history, past social history, and problem list.    OBJECTIVE: Initial Physical Exam (New OB)  GENERAL APPEARANCE: alert, well appearing, in no apparent distress,  oriented to person, place and time HEAD: normocephalic, atraumatic MOUTH: mucous membranes moist, pharynx normal without lesions THYROID: no thyromegaly or masses present BREASTS: no masses noted, no significant tenderness, no palpable axillary nodes, no skin changes LUNGS: clear to auscultation, no wheezes, rales or rhonchi, symmetric air entry HEART: regular rate and rhythm, no murmurs ABDOMEN: soft, nontender, nondistended, no abnormal masses, no epigastric pain and FHT present EXTREMITIES: no redness or tenderness in the calves or thighs, no edema, no limitation in range of motion, intact peripheral pulses SKIN: normal coloration and turgor, no rashes LYMPH NODES: no adenopathy palpable NEUROLOGIC: alert, oriented, normal speech, no focal findings or movement disorder noted  PELVIC EXAM EXTERNAL GENITALIA: normal appearing vulva with no masses, tenderness or lesions VAGINA: no abnormal discharge or lesions CERVIX: no lesions or cervical motion tenderness UTERUS: gravid ADNEXA: no masses palpable and nontender OB EXAM PELVIMETRY: appears adequate RECTUM: exam not indicated  ASSESSMENT: Normal pregnancy  PLAN: Prenatal care See ordersNew OB counseling: The patient has been given an overview regarding routine prenatal care. Recommendations regarding diet, weight gain, and exercise in pregnancy were given. Prenatal testing, optional genetic testing, carrier screening, and ultrasound use in pregnancy were reviewed. Panorama today.  Benefits of Breast Feeding were discussed. The patient is encouraged to consider nursing her baby post partum.  Doreene Burke, CNM

## 2020-06-04 LAB — CBC
Hematocrit: 36.8 % (ref 34.0–46.6)
Hemoglobin: 12.4 g/dL (ref 11.1–15.9)
MCH: 28.1 pg (ref 26.6–33.0)
MCHC: 33.7 g/dL (ref 31.5–35.7)
MCV: 83 fL (ref 79–97)
Platelets: 285 10*3/uL (ref 150–450)
RBC: 4.42 x10E6/uL (ref 3.77–5.28)
RDW: 12.8 % (ref 11.7–15.4)
WBC: 13.7 10*3/uL — ABNORMAL HIGH (ref 3.4–10.8)

## 2020-06-05 LAB — URINE CULTURE: Organism ID, Bacteria: NO GROWTH

## 2020-06-11 ENCOUNTER — Encounter: Payer: No Typology Code available for payment source | Admitting: Certified Nurse Midwife

## 2020-07-01 ENCOUNTER — Encounter: Payer: Self-pay | Admitting: Certified Nurse Midwife

## 2020-07-01 ENCOUNTER — Ambulatory Visit (INDEPENDENT_AMBULATORY_CARE_PROVIDER_SITE_OTHER): Payer: No Typology Code available for payment source | Admitting: Certified Nurse Midwife

## 2020-07-01 ENCOUNTER — Other Ambulatory Visit: Payer: Self-pay

## 2020-07-01 VITALS — BP 106/71 | HR 79 | Wt 158.7 lb

## 2020-07-01 DIAGNOSIS — Z3A16 16 weeks gestation of pregnancy: Secondary | ICD-10-CM

## 2020-07-01 DIAGNOSIS — Z3689 Encounter for other specified antenatal screening: Secondary | ICD-10-CM

## 2020-07-01 DIAGNOSIS — Z3482 Encounter for supervision of other normal pregnancy, second trimester: Secondary | ICD-10-CM

## 2020-07-01 LAB — POCT URINALYSIS DIPSTICK OB
Bilirubin, UA: NEGATIVE
Blood, UA: NEGATIVE
Glucose, UA: NEGATIVE
Ketones, UA: NEGATIVE
Leukocytes, UA: NEGATIVE
Nitrite, UA: NEGATIVE
POC,PROTEIN,UA: NEGATIVE
Spec Grav, UA: 1.02 (ref 1.010–1.025)
Urobilinogen, UA: 0.2 E.U./dL
pH, UA: 6 (ref 5.0–8.0)

## 2020-07-01 NOTE — Progress Notes (Signed)
Pt present for routine prenatal visit. No complaints. °

## 2020-07-01 NOTE — Progress Notes (Signed)
I have seen, interviewed, and examined the patient in conjunction with the [facility] midwife and affirm the diagnosis and management plan.   Gunnar Bulla, CNM [facility], [facility] 07/01/20 9:20 AM

## 2020-07-01 NOTE — Progress Notes (Signed)
ROB doing well. Discussed round ligament and back pain in the second trimester. Questions and concerns addressed. Anticipatory guidance regarding course of pregnancy. Reviewed flags reviewed and when to call. RTC x 4 weeks for ANATOMY ultrasound and ROB or sooner if needed

## 2020-07-01 NOTE — Patient Instructions (Signed)
Second Trimester of Pregnancy  The second trimester is from week 14 through week 27 (month 4 through 6). This is often the time in pregnancy that you feel your best. Often times, morning sickness has lessened or quit. You may have more energy, and you may get hungry more often. Your unborn baby is growing rapidly. At the end of the sixth month, he or she is about 9 inches long and weighs about 1 pounds. You will likely feel the baby move between 18 and 20 weeks of pregnancy. Follow these instructions at home: Medicines  Take over-the-counter and prescription medicines only as told by your doctor. Some medicines are safe and some medicines are not safe during pregnancy.  Take a prenatal vitamin that contains at least 600 micrograms (mcg) of folic acid.  If you have trouble pooping (constipation), take medicine that will make your stool soft (stool softener) if your doctor approves. Eating and drinking   Eat regular, healthy meals.  Avoid raw meat and uncooked cheese.  If you get low calcium from the food you eat, talk to your doctor about taking a daily calcium supplement.  Avoid foods that are high in fat and sugars, such as fried and sweet foods.  If you feel sick to your stomach (nauseous) or throw up (vomit): ? Eat 4 or 5 small meals a day instead of 3 large meals. ? Try eating a few soda crackers. ? Drink liquids between meals instead of during meals.  To prevent constipation: ? Eat foods that are high in fiber, like fresh fruits and vegetables, whole grains, and beans. ? Drink enough fluids to keep your pee (urine) clear or pale yellow. Activity  Exercise only as told by your doctor. Stop exercising if you start to have cramps.  Do not exercise if it is too hot, too humid, or if you are in a place of great height (high altitude).  Avoid heavy lifting.  Wear low-heeled shoes. Sit and stand up straight.  You can continue to have sex unless your doctor tells you not  to. Relieving pain and discomfort  Wear a good support bra if your breasts are tender.  Take warm water baths (sitz baths) to soothe pain or discomfort caused by hemorrhoids. Use hemorrhoid cream if your doctor approves.  Rest with your legs raised if you have leg cramps or low back pain.  If you develop puffy, bulging veins (varicose veins) in your legs: ? Wear support hose or compression stockings as told by your doctor. ? Raise (elevate) your feet for 15 minutes, 3-4 times a day. ? Limit salt in your food. Prenatal care  Write down your questions. Take them to your prenatal visits.  Keep all your prenatal visits as told by your doctor. This is important. Safety  Wear your seat belt when driving.  Make a list of emergency phone numbers, including numbers for family, friends, the hospital, and police and fire departments. General instructions  Ask your doctor about the right foods to eat or for help finding a counselor, if you need these services.  Ask your doctor about local prenatal classes. Begin classes before month 6 of your pregnancy.  Do not use hot tubs, steam rooms, or saunas.  Do not douche or use tampons or scented sanitary pads.  Do not cross your legs for long periods of time.  Visit your dentist if you have not done so. Use a soft toothbrush to brush your teeth. Floss gently.  Avoid all smoking, herbs,   and alcohol. Avoid drugs that are not approved by your doctor.  Do not use any products that contain nicotine or tobacco, such as cigarettes and e-cigarettes. If you need help quitting, ask your doctor.  Avoid cat litter boxes and soil used by cats. These carry germs that can cause birth defects in the baby and can cause a loss of your baby (miscarriage) or stillbirth. Contact a doctor if:  You have mild cramps or pressure in your lower belly.  You have pain when you pee (urinate).  You have bad smelling fluid coming from your vagina.  You continue to  feel sick to your stomach (nauseous), throw up (vomit), or have watery poop (diarrhea).  You have a nagging pain in your belly area.  You feel dizzy. Get help right away if:  You have a fever.  You are leaking fluid from your vagina.  You have spotting or bleeding from your vagina.  You have severe belly cramping or pain.  You lose or gain weight rapidly.  You have trouble catching your breath and have chest pain.  You notice sudden or extreme puffiness (swelling) of your face, hands, ankles, feet, or legs.  You have not felt the baby move in over an hour.  You have severe headaches that do not go away when you take medicine.  You have trouble seeing. Summary  The second trimester is from week 14 through week 27 (months 4 through 6). This is often the time in pregnancy that you feel your best.  To take care of yourself and your unborn baby, you will need to eat healthy meals, take medicines only if your doctor tells you to do so, and do activities that are safe for you and your baby.  Call your doctor if you get sick or if you notice anything unusual about your pregnancy. Also, call your doctor if you need help with the right food to eat, or if you want to know what activities are safe for you. This information is not intended to replace advice given to you by your health care provider. Make sure you discuss any questions you have with your health care provider. Document Revised: 10/28/2018 Document Reviewed: 08/11/2016 Elsevier Patient Education  2020 [employer]   Back Pain in Pregnancy Back pain during pregnancy is common. Back pain may be caused by several factors that are related to changes during your pregnancy. Follow these instructions at home: Managing pain, stiffness, and swelling      If directed, for sudden (acute) back pain, put ice on the painful area. ? Put ice in a plastic bag. ? Place a towel between your skin and the bag. ? Leave the ice on for 20  minutes, 2-3 times per day.  If directed, apply heat to the affected area before you exercise. Use the heat source that your health care provider recommends, such as a moist heat pack or a heating pad. ? Place a towel between your skin and the heat source. ? Leave the heat on for 20-30 minutes. ? Remove the heat if your skin turns bright red. This is especially important if you are unable to feel pain, heat, or cold. You may have a greater risk of getting burned.  If directed, massage the affected area. Activity  Exercise as told by your health care provider. Gentle exercise is the best way to prevent or manage back pain.  Listen to your body when lifting. If lifting hurts, ask for help or bend your   knees. This uses your leg muscles instead of your back muscles.  Squat down when picking up something from the floor. Do not bend over.  Only use bed rest for short periods as told by your health care provider. Bed rest should only be used for the most severe episodes of back pain. Standing, sitting, and lying down  Do not stand in one place for long periods of time.  Use good posture when sitting. Make sure your head rests over your shoulders and is not hanging forward. Use a pillow on your lower back if necessary.  Try sleeping on your side, preferably the left side, with a pregnancy support pillow or 1-2 regular pillows between your legs. ? If you have back pain after a night's rest, your bed may be too soft. ? A firm mattress may provide more support for your back during pregnancy. General instructions  Do not wear high heels.  Eat a healthy diet. Try to gain weight within your health care provider's recommendations.  Use a maternity girdle, elastic sling, or back brace as told by your health care provider.  Take over-the-counter and prescription medicines only as told by your health care provider.  Work with a physical therapist or massage therapist to find ways to manage back  pain. Acupuncture or massage therapy may be helpful.  Keep all follow-up visits as told by your health care provider. This is important. Contact a health care provider if:  Your back pain interferes with your daily activities.  You have increasing pain in other parts of your body. Get help right away if:  You develop numbness, tingling, weakness, or problems with the use of your arms or legs.  You develop severe back pain that is not controlled with medicine.  You have a change in bowel or bladder control.  You develop shortness of breath, dizziness, or you faint.  You develop nausea, vomiting, or sweating.  You have back pain that is a rhythmic, cramping pain similar to labor pains. Labor pain is usually 1-2 minutes apart, lasts for about 1 minute, and involves a bearing down feeling or pressure in your pelvis.  You have back pain and your water breaks or you have vaginal bleeding.  You have back pain or numbness that travels down your leg.  Your back pain developed after you fell.  You develop pain on one side of your back.  You see blood in your urine.  You develop skin blisters in the area of your back pain. Summary  Back pain may be caused by several factors that are related to changes during your pregnancy.  Follow instructions as told by your health care provider for managing pain, stiffness, and swelling.  Exercise as told by your health care provider. Gentle exercise is the best way to prevent or manage back pain.  Take over-the-counter and prescription medicines only as told by your health care provider.  Keep all follow-up visits as told by your health care provider. This is important. This information is not intended to replace advice given to you by your health care provider. Make sure you discuss any questions you have with your health care provider. Document Revised: 10/25/2018 Document Reviewed: 12/22/2017 Elsevier Patient Education  2020 [employer]  [employer]   Round Ligament Pain  The round ligament is a cord of muscle and tissue that helps support the uterus. It can become a source of pain during pregnancy if it becomes stretched or twisted as the baby grows. The pain usually   begins in the second trimester (13-28 weeks) of pregnancy, and it can come and go until the baby is delivered. It is not a serious problem, and it does not cause harm to the baby. Round ligament pain is usually a short, sharp, and pinching pain, but it can also be a dull, lingering, and aching pain. The pain is felt in the lower side of the abdomen or in the groin. It usually starts deep in the groin and moves up to the outside of the hip area. The pain may occur when you:  Suddenly change position, such as quickly going from a sitting to standing position.  Roll over in bed.  Cough or sneeze.  Do physical activity. Follow these instructions at home:   Watch your condition for any changes.  When the pain starts, relax. Then try any of these methods to help with the pain: ? Sitting down. ? Flexing your knees up to your abdomen. ? Lying on your side with one pillow under your abdomen and another pillow between your legs. ? Sitting in a warm bath for 15-20 minutes or until the pain goes away.  Take over-the-counter and prescription medicines only as told by your health care provider.  Move slowly when you sit down or stand up.  Avoid long walks if they cause pain.  Stop or reduce your physical activities if they cause pain.  Keep all follow-up visits as told by your health care provider. This is important. Contact a health care provider if:  Your pain does not go away with treatment.  You feel pain in your back that you did not have before.  Your medicine is not helping. Get help right away if:  You have a fever or chills.  You develop uterine contractions.  You have vaginal bleeding.  You have nausea or vomiting.  You have diarrhea.  You  have pain when you urinate. Summary  Round ligament pain is felt in the lower abdomen or groin. It is usually a short, sharp, and pinching pain. It can also be a dull, lingering, and aching pain.  This pain usually begins in the second trimester (13-28 weeks). It occurs because the uterus is stretching with the growing baby, and it is not harmful to the baby.  You may notice the pain when you suddenly change position, when you cough or sneeze, or during physical activity.  Relaxing, flexing your knees to your abdomen, lying on one side, or taking a warm bath may help to get rid of the pain.  Get help from your health care provider if the pain does not go away or if you have vaginal bleeding, nausea, vomiting, diarrhea, or painful urination. This information is not intended to replace advice given to you by your health care provider. Make sure you discuss any questions you have with your health care provider. Document Revised: 12/22/2017 Document Reviewed: 12/22/2017 Elsevier Patient Education  2020 [employer] Inc.  

## 2020-07-20 NOTE — L&D Delivery Note (Signed)
Delivery Note  Called in room to see patient, reports pelvic pressure and the urge to push. SVE: 10/100/+1, vertex with right occiput posterior presentation at 0421.   Successful two finger rotation of fetal head after natural tearing of vaginal band with coached maternal pushing efforts.    Spontaneous vaginal birth of live born female infant in right occiput anterior position at 0449 by Brayton Mars, SNM. Single loop of loose nuchal reduced on perineum and infant immediately to maternal. Delayed cord clamping, skin to skin, and three(3) vessel cord. APGARs: 8, 9. Weight pending. Receiving nurse present at bedside for birth.   Large gush of blood noted. Pitocin bolus infusing.  Spontaneous delivery of intact placenta at 0455. Steady flow of blood noted from laceration and care assumed by CNM and Dr. Valentino Saxon notified.  PR Cytotec given, see orders. Uterus firm. Second degree perineal and bilateral labial lacerations of friable tissue repaired with 3-0 vicryl rapid under local and epidural anesthesia. Lacerations well approximated and hemostatic. Vault check completed after administration of IV pain medication. Uterus firm. Rubra moderate. QBL: 1381. CBC ordered, see chart.   Update given to patient, family and Dr. Valentino Saxon.    Initiate routine postpartum care and orders. Mom to postpartum.  Baby to Couplet care / Skin to Skin.  FOB and patient's mother present at bedside and overjoyed with the birth of "[name]".    [name], CNM [facility], [facility] 12/15/2020, 6:14 AM

## 2020-07-24 ENCOUNTER — Other Ambulatory Visit: Payer: Self-pay

## 2020-07-24 ENCOUNTER — Ambulatory Visit (INDEPENDENT_AMBULATORY_CARE_PROVIDER_SITE_OTHER): Payer: No Typology Code available for payment source | Admitting: Certified Nurse Midwife

## 2020-07-24 ENCOUNTER — Ambulatory Visit (INDEPENDENT_AMBULATORY_CARE_PROVIDER_SITE_OTHER): Payer: No Typology Code available for payment source

## 2020-07-24 VITALS — BP 95/60 | HR 72 | Wt 159.6 lb

## 2020-07-24 DIAGNOSIS — Z3689 Encounter for other specified antenatal screening: Secondary | ICD-10-CM

## 2020-07-24 DIAGNOSIS — Z3482 Encounter for supervision of other normal pregnancy, second trimester: Secondary | ICD-10-CM

## 2020-07-24 DIAGNOSIS — Z3A19 19 weeks gestation of pregnancy: Secondary | ICD-10-CM

## 2020-07-24 LAB — POCT URINALYSIS DIPSTICK OB
Bilirubin, UA: NEGATIVE
Blood, UA: NEGATIVE
Glucose, UA: NEGATIVE
Ketones, UA: NEGATIVE
Leukocytes, UA: NEGATIVE
Nitrite, UA: NEGATIVE
POC,PROTEIN,UA: NEGATIVE
Spec Grav, UA: 1.01 (ref 1.010–1.025)
Urobilinogen, UA: 0.2 E.U./dL
pH, UA: 5 (ref 5.0–8.0)

## 2020-07-24 NOTE — Progress Notes (Signed)
Melinda Wells doing well. Thinks she is feeling some movement.Anaotmy scan today (results reviewed). Scan incomplete.She will return in 2 wk for completion. Musculoskeletal discomforts of pregnancy discussed. Reviewed self help measures. Follow up  Scan 2 wks, Melinda Wells with Marcelino Duster 4 wk.  Doreene Burke, CNM   Patient Name: Melinda Wells DOB: Aug 30, 1985 MRN: [id number] ULTRASOUND REPORT  Location: [facility] Date of Service: 07/24/2020   Indications:Anatomy Ultrasound Findings:  Melinda Wells intrauterine pregnancy is visualized with FHR at 143 BPM. Biometrics give an (U/S) Gestational age of [redacted]w[redacted]d and an (U/S) EDD of 12/18/2020; this correlates with the clinically established Estimated Date of Delivery: 12/13/20  Fetal presentation is transverse.  EFW: 313 g ( 11 oz). Fetal Percentile  Placenta: posterior. Grade: 1 AFI: subjectively normal.  Anatomic survey is incomplete for heart and normal; Gender - female.    Right Ovary is normal in appearance. Left Ovary is normal appearance. Survey of the adnexa demonstrates no adnexal masses. There is no free peritoneal fluid in the cul de sac.  Impression: 1. [redacted]w[redacted]d Viable Singleton Intrauterine pregnancy by U/S. 2. (U/S) EDD is consistent with Clinically established Estimated Date of Delivery: 12/13/20 . 3. Incomplete for fetal heart  Recommendations: 1.Clinical correlation with the patient's History and Physical Exam.   Jenine M. Marciano Sequin    RDMS

## 2020-07-24 NOTE — Patient Instructions (Signed)

## 2020-08-08 ENCOUNTER — Other Ambulatory Visit: Payer: No Typology Code available for payment source

## 2020-08-14 ENCOUNTER — Other Ambulatory Visit: Payer: Self-pay

## 2020-08-14 ENCOUNTER — Ambulatory Visit (INDEPENDENT_AMBULATORY_CARE_PROVIDER_SITE_OTHER): Payer: No Typology Code available for payment source

## 2020-08-14 DIAGNOSIS — Z3A22 22 weeks gestation of pregnancy: Secondary | ICD-10-CM

## 2020-08-14 DIAGNOSIS — Z3A19 19 weeks gestation of pregnancy: Secondary | ICD-10-CM | POA: Diagnosis not present

## 2020-08-14 DIAGNOSIS — Z362 Encounter for other antenatal screening follow-up: Secondary | ICD-10-CM | POA: Diagnosis not present

## 2020-08-23 ENCOUNTER — Ambulatory Visit (INDEPENDENT_AMBULATORY_CARE_PROVIDER_SITE_OTHER): Payer: No Typology Code available for payment source | Admitting: Certified Nurse Midwife

## 2020-08-23 ENCOUNTER — Other Ambulatory Visit: Payer: Self-pay

## 2020-08-23 VITALS — BP 107/63 | HR 80 | Wt 162.3 lb

## 2020-08-23 DIAGNOSIS — Z3A24 24 weeks gestation of pregnancy: Secondary | ICD-10-CM

## 2020-08-23 DIAGNOSIS — Z3482 Encounter for supervision of other normal pregnancy, second trimester: Secondary | ICD-10-CM

## 2020-08-23 LAB — POCT URINALYSIS DIPSTICK OB
Bilirubin, UA: NEGATIVE
Blood, UA: NEGATIVE
Glucose, UA: NEGATIVE
Ketones, UA: NEGATIVE
Leukocytes, UA: NEGATIVE
Nitrite, UA: NEGATIVE
POC,PROTEIN,UA: NEGATIVE
Spec Grav, UA: 1.025 (ref 1.010–1.025)
Urobilinogen, UA: 0.2 E.U./dL
pH, UA: 5 (ref 5.0–8.0)

## 2020-08-23 NOTE — Patient Instructions (Signed)
WHAT OB PATIENTS CAN EXPECT   Confirmation of pregnancy and ultrasound ordered if medically indicated-[redacted] weeks gestation  New OB (NOB) intake with nurse and New OB (NOB) labs- [redacted] weeks gestation  New OB (NOB) physical examination with provider- 11/[redacted] weeks gestation  Flu vaccine-[redacted] weeks gestation  Anatomy scan-[redacted] weeks gestation  Glucose tolerance test, blood work to test for anemia, T-dap vaccine-[redacted] weeks gestation  Vaginal swabs/cultures-STD/Group B strep-[redacted] weeks gestation  Appointments every 4 weeks until 28 weeks  Every 2 weeks from 28 weeks until 36 weeks  Weekly visits from 36 weeks until delivery    Common Medications Safe in Pregnancy  Acne:      Constipation:  Benzoyl Peroxide     Colace  Clindamycin      Dulcolax Suppository  Topica Erythromycin     Fibercon  Salicylic Acid      Metamucil         Miralax AVOID:        Senakot   Accutane    Cough:  Retin-A       Cough Drops  Tetracycline      Phenergan w/ Codeine if Rx  Minocycline      Robitussin (Plain & DM)  Antibiotics:     Crabs/Lice:  Ceclor       RID  Cephalosporins    AVOID:  E-Mycins      Kwell  Keflex  Macrobid/Macrodantin   Diarrhea:  Penicillin      Kao-Pectate  Zithromax      Imodium AD         PUSH FLUIDS AVOID:       Cipro     Fever:  Tetracycline      Tylenol (Regular or Extra  Minocycline       Strength)  Levaquin      Extra Strength-Do not          Exceed 8 tabs/24 hrs Caffeine:        <298m/day (equiv. To 1 cup of coffee or  approx. 3 12 oz sodas)         Gas: Cold/Hayfever:       Gas-X  Benadryl      Mylicon  Claritin       Phazyme  **Claritin-D        Chlor-Trimeton    Headaches:  Dimetapp      ASA-Free Excedrin  Drixoral-Non-Drowsy     Cold Compress  Mucinex (Guaifenasin)     Tylenol (Regular or Extra  Sudafed/Sudafed-12 Hour     Strength)  **Sudafed PE Pseudoephedrine   Tylenol Cold & Sinus     Vicks Vapor Rub  Zyrtec  **AVOID if Problems With Blood  Pressure         Heartburn: Avoid lying down for at least 1 hour after meals  Aciphex      Maalox     Rash:  Milk of Magnesia     Benadryl    Mylanta       1% Hydrocortisone Cream  Pepcid  Pepcid Complete   Sleep Aids:  Prevacid      Ambien   Prilosec       Benadryl  Rolaids       Chamomile Tea  Tums (Limit 4/day)     Unisom         Tylenol PM         Warm milk-add vanilla or  Hemorrhoids:       Sugar for taste  Anusol/Anusol H.C.  (RX:  Analapram 2.5%)  Sugar Substitutes:  Hydrocortisone OTC     Ok in moderation  Preparation H      Tucks        Vaseline lotion applied to tissue with wiping    Herpes:     Throat:  Acyclovir      Oragel  Famvir  Valtrex     Vaccines:         Flu Shot Leg Cramps:       *Gardasil  Benadryl      Hepatitis A         Hepatitis B Nasal Spray:       Pneumovax  Saline Nasal Spray     Polio Booster         Tetanus Nausea:       Tuberculosis test or PPD  Vitamin B6 25 mg TID   AVOID:    Dramamine      *Gardasil  Emetrol       Live Poliovirus  Ginger Root 250 mg QID    MMR (measles, mumps &  High Complex Carbs @ Bedtime    rebella)  Sea Bands-Accupressure    Varicella (Chickenpox)  Unisom 1/2 tab TID     *No known complications           If received before Pain:         Known pregnancy;   Darvocet       Resume series after  Lortab        Delivery  Percocet    Yeast:   Tramadol      Femstat  Tylenol 3      Gyne-lotrimin  Ultram       Monistat  Vicodin           MISC:         All Sunscreens           Hair Coloring/highlights          Insect Repellant's          (Including DEET)         Mystic Tans   Third Trimester of Pregnancy  The third trimester of pregnancy is from week 28 through week 40. This is also called months 7 through 9. This trimester is when your unborn baby (fetus) is growing very fast. At the end of the ninth month, the unborn baby is about 20 inches long. It weighs about 6-10 pounds. Body changes during your  third trimester Your body continues to go through many changes during this time. The changes vary and generally return to normal after the baby is born. Physical changes  Your weight will continue to increase. You may gain 25-35 pounds (11-16 kg) by the end of the pregnancy. If you are underweight, you may gain 28-40 lb (about 13-18 kg). If you are overweight, you may gain 15-25 lb (about 7-11 kg).  You may start to get stretch marks on your hips, belly (abdomen), and breasts.  Your breasts will continue to grow and may hurt. A yellow fluid (colostrum) may leak from your breasts. This is the first milk you are making for your baby.  You may have changes in your hair.  Your belly button may stick out.  You may have more swelling in your hands, face, or ankles. Health changes  You may have heartburn.  You may have trouble pooping (constipation).  You may get hemorrhoids. These are swollen veins in the butt that can itch or get painful.  You may have swollen veins (varicose veins) in your legs.  You may have more body aches in the pelvis, back, or thighs.  You may have more tingling or numbness in your hands, arms, and legs. The skin on your belly may also feel numb.  You may feel short of breath as your womb (uterus) gets bigger. Other changes  You may pee (urinate) more often.  You may have more problems sleeping.  You may notice the unborn baby "dropping," or moving lower in your belly.  You may have more discharge coming from your vagina.  Your joints may feel loose, and you may have pain around your pelvic bone. Follow these instructions at home: Medicines  Take over-the-counter and prescription medicines only as told by your doctor. Some medicines are not safe during pregnancy.  Take a prenatal vitamin that contains at least 600 micrograms (mcg) of folic acid. Eating and drinking  Eat healthy meals that include: ? Fresh fruits and vegetables. ? Whole  grains. ? Good sources of protein, such as meat, eggs, or tofu. ? Low-fat dairy products.  Avoid raw meat and unpasteurized juice, milk, and cheese. These carry germs that can harm you and your baby.  Eat 4 or 5 small meals rather than 3 large meals a day.  You may need to take these actions to prevent or treat trouble pooping: ? Drink enough fluids to keep your pee (urine) pale yellow. ? Eat foods that are high in fiber. These include beans, whole grains, and fresh fruits and vegetables. ? Limit foods that are high in fat and sugar. These include fried or sweet foods. Activity  Exercise only as told by your doctor. Stop exercising if you start to have cramps in your womb.  Avoid heavy lifting.  Do not exercise if it is too hot or too humid, or if you are in a place of great height (high altitude).  If you choose to, you may have sex unless your doctor tells you not to. Relieving pain and discomfort  Take breaks often, and rest with your legs raised (elevated) if you have leg cramps or low back pain.  Take warm water baths (sitz baths) to soothe pain or discomfort caused by hemorrhoids. Use hemorrhoid cream if your doctor approves.  Wear a good support bra if your breasts are tender.  If you develop bulging, swollen veins in your legs: ? Wear support hose as told by your doctor. ? Raise your feet for 15 minutes, 3-4 times a day. ? Limit salt in your food. Safety  Talk to your doctor before traveling far distances.  Do not use hot tubs, steam rooms, or saunas.  Wear your seat belt at all times when you are in a car.  Talk with your doctor if someone is hurting you or yelling at you a lot. Preparing for your baby's arrival To prepare for the arrival of your baby:  Take prenatal classes.  Visit the hospital and tour the maternity area.  Buy a rear-facing car seat. Learn how to install it in your car.  Prepare the baby's room. Take out all pillows and stuffed animals  from the baby's crib. General instructions  Avoid cat litter boxes and soil used by cats. These carry germs that can cause harm to the baby and can cause a loss of your baby by miscarriage or stillbirth.  Do not douche or use tampons. Do not use scented sanitary pads.  Do not smoke or use any products that  contain nicotine or tobacco. If you need help quitting, ask your doctor.  Do not drink alcohol.  Do not use herbal medicines, illegal drugs, or medicines that were not approved by your doctor. Chemicals in these products can affect your baby.  Keep all follow-up visits. This is important. Where to find more information  American Pregnancy Association: americanpregnancy.org  [employer] of [employer]stetricians [employer] Gynecologists: www.acog.org  Office on Women's Health: KeywordPortfolios.com.br Contact a doctor if:  You have a fever.  You have mild cramps or pressure in your lower belly.  You have a nagging pain in your belly area.  You vomit, or you have watery poop (diarrhea).  You have bad-smelling fluid coming from your vagina.  You have pain when you pee, or your pee smells bad.  You have a headache that does not go away when you take medicine.  You have changes in how you see, or you see spots in front of your eyes. Get help right away if:  Your water breaks.  You have regular contractions that are less than 5 minutes apart.  You are spotting or bleeding from your vagina.  You have very bad belly cramps or pain.  You have trouble breathing.  You have chest pain.  You faint.  You have not felt the baby move for the amount of time told by your doctor.  You have new or increased pain, swelling, or redness in an arm or leg. Summary  The third trimester is from week 28 through week 40 (months 7 through 9). This is the time when your unborn baby is growing very fast.  During this time, your discomfort may increase as you gain weight and as your baby  grows.  Get ready for your baby to arrive by taking prenatal classes, buying a rear-facing car seat, and preparing the baby's room.  Get help right away if you are bleeding from your vagina, you have chest pain and trouble breathing, or you have not felt the baby move for the amount of time told by your doctor. This information is not intended to replace advice given to you by your health care provider. Make sure you discuss any questions you have with your health care provider. Document Revised: 12/13/2019 Document Reviewed: 10/19/2019 Elsevier Patient Education  2021 [employer].

## 2020-08-23 NOTE — Progress Notes (Signed)
ROB-Doing well. 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 Anticipatory guidance regarding course of prenatal care. Reviewed red flag symptoms and when to call. RTC x 4 weeks for 28 week labs, TDaP, and ROB with [name] or sooner if needed.

## 2020-09-19 ENCOUNTER — Other Ambulatory Visit: Payer: No Typology Code available for payment source

## 2020-09-20 ENCOUNTER — Other Ambulatory Visit: Payer: No Typology Code available for payment source

## 2020-09-20 ENCOUNTER — Other Ambulatory Visit: Payer: Self-pay

## 2020-09-20 DIAGNOSIS — Z3403 Encounter for supervision of normal first pregnancy, third trimester: Secondary | ICD-10-CM

## 2020-09-23 ENCOUNTER — Other Ambulatory Visit: Payer: No Typology Code available for payment source

## 2020-09-23 ENCOUNTER — Encounter: Payer: No Typology Code available for payment source | Admitting: Certified Nurse Midwife

## 2020-09-24 ENCOUNTER — Other Ambulatory Visit: Payer: No Typology Code available for payment source

## 2020-09-25 ENCOUNTER — Other Ambulatory Visit: Payer: Self-pay

## 2020-09-25 ENCOUNTER — Ambulatory Visit (INDEPENDENT_AMBULATORY_CARE_PROVIDER_SITE_OTHER): Payer: No Typology Code available for payment source | Admitting: Certified Nurse Midwife

## 2020-09-25 ENCOUNTER — Encounter: Payer: Self-pay | Admitting: Certified Nurse Midwife

## 2020-09-25 VITALS — Wt 160.0 lb

## 2020-09-25 DIAGNOSIS — O2610 Low weight gain in pregnancy, unspecified trimester: Secondary | ICD-10-CM

## 2020-09-25 DIAGNOSIS — Z3A28 28 weeks gestation of pregnancy: Secondary | ICD-10-CM

## 2020-09-25 NOTE — Progress Notes (Signed)
Virtual Visit via Telephone Note  I connected with Melinda Wells on 09/25/20 at 10:45 AM EST by telephone and verified that I am speaking with the correct person using two identifiers.  Location: Patient: at home Provider: at the office    I discussed the limitations, risks, security and privacy concerns of performing an evaluation and management service by telephone and the availability of in person appointments. I also discussed with the patient that there may be a patient responsible charge related to this service. The patient expressed understanding and agreed to proceed.   History of Present Illness: G3P0 @ 28 wk 5 days COVID symptom 3/3 tested negative, retested Saturday was positive. State she has had some coughing , body aches, chills but temp did not get above 99.    Observations/Objective: She is feeling better now, is feeling good fetal movement. Denies ctx, loss of fluid , and vaginal bleeding. Pt expresses concern about fetal growth due to poor weight gain. Discussed completing u/s for growth. She is in agreement.  Assessment and Plan: Reviewed red flag symptoms. Follow up next week for 28 wk labs or prn to hospital for any red flag symptoms.   Follow Up Instructions: Monday or Tuesday next week.    I discussed the assessment and treatment plan with the patient. The patient was provided an opportunity to ask questions and all were answered. The patient agreed with the plan and demonstrated an understanding of the instructions.   The patient was advised to call back or seek an in-person evaluation if the symptoms worsen or if the condition fails to improve as anticipated.  I provided 7 minutes of non-face-to-face time during this encounter.   Doreene Burke, CNM

## 2020-09-25 NOTE — Patient Instructions (Signed)
Pregnancy and COVID-19 Pregnant women and women who were recently pregnant are at an increased risk for severe illness from COVID-19. Other conditions, such as being pregnant at an older age or having diabetes or obesity, can further increase the risk of severe illness from COVID-19. This risk can last for at least 42 days following the end of the pregnancy. Protect yourself and your baby by:  Knowing your risk factors. Ask your health care provider about your specific risk factors.  Working with your health care team to protect yourself against all infections, including COVID-19. How does COVID-19 affect me? If you get COVID-19 while pregnant or shortly after your pregnancy, there is an increased risk that you may:  Get a respiratory illness that can lead to pneumonia or severe illness.  Give birth to your baby before 37 weeks of pregnancy (preterm birth).  Have other complications that can affect your pregnancy. How does COVID-19 affect my care? If you have COVID-19, special precautions will be taken around your pregnancy:  You will have to notify the clinic or hospital before a visit. Steps will be taken to protect other people from the virus, including seeing you in a special room.  Tests and scans may be done differently before delivery (prenatal care).  Your birth plan may change, including what room you will be in and who may be with you during labor and delivery.  You may stay longer in the hospital after delivery (postpartum care).  COVID-19 will affect where your baby will stay after delivery. Ask about the risks and benefits of staying in the same room with your baby. Benefits include breastfeeding and mother-newborn bonding.  You may have to feed your baby differently.  Visitors will be limited after your baby is born. How does COVID-19 affect my baby? It is very rare for a mother with COVID-19 to pass the virus to the unborn baby. After birth, a baby can get the virus if  he or she is exposed to it. Ask your health care provider about ways to protect your baby. The baby can be placed in an incubator. A physical barrier can also be used. What can I do to lower my risk? Medicines and vaccines  You can receive a COVID-19 vaccination. This can protect you from severe illness. If you have concerns, talk to your health care provider.  Get other recommended vaccines, including the flu vaccine and the whooping cough (Tdap) vaccine.  Ask your health care provider if you can get a 30-day, or longer, supply of your medicines, so you can make fewer trips to the pharmacy.  If you have received a COVID-19 vaccine, consider enrolling in the v-safe program from the CDC. This program uses an app on your smartphone to provide check-ins and gather information on your health after you receive the vaccine. There is a separate registry for pregnant women. For more information, visit: ? V-safe tool: http://boyd.org/ ? V-safe pregnancy registry: SuperiorMarketers.be Cleaning and personal hygiene If you are in isolation for COVID-19 and are sharing a room with your newborn, take these steps to reduce the risk of spreading the virus to your newborn:  Wash your hands with soap and water for at least 20 seconds before holding or caring for your baby. If soap and water are not available, use alcohol-based hand sanitizer.  Wear a mask when within 6 feet (1.8 m) of your baby.  Keep your baby more than 6 feet (1.8 m) away from you as much as possible.  Avoid touching your mouth, face, eyes, or nose before washing your hands.  Clean and disinfect objects and surfaces that are frequently touched.   Other things to do  Avoid people who might have been exposed to or infected with COVID-19, including people who live with you.  Cover your mouth and nose by wearing a mask or other cloth covering over your face when you go out in public.  Avoid people who are not wearing  a mask.  Avoid large crowds. Maintain at least 6 feet (1.8 m) between yourself and others.  Avoid poorly ventilated spaces.  Call your health care provider if you have any health concerns. ? Contact your health care provider right away if you think you have COVID-19. Tell your health care provider that you think you may have a COVID-19 infection and that you are pregnant. Breastfeeding tips Plan with your family and health care team how to feed your baby. Current research shows that the virus may not pass to a baby through breast milk. If you are breastfeeding, you can receive a COVID-19 vaccine. The vaccines pose no risk for breastfeeding mothers or their babies.  Some of the vaccines might create antibodies in breast milk. These antibodies can help to protect your baby. Take precautions if you have or may have COVID-19. Precautions include:  Washing hands with soap and water for at least 20 seconds before feeding your baby. If soap and water are not available, use alcohol-based hand sanitizer.  Wearing a mask while feeding your baby.  Pumping or expressing breast milk to feed to your baby. If possible, ask someone in your household who is not sick to feed your baby the expressed breast milk. ? Wash your hands with soap and water for at least 20 seconds before touching pump parts. ? Wash and disinfect all pump parts after expressing milk. Follow the manufacturer's instructions to clean and disinfect all pump parts. Follow these instructions: Managing stress Some pregnant and postpartum women may have fear, uncertainty, and stress because of COVID-19. Find ways to manage stress. These may include:  Using relaxation techniques such as meditation and deep breathing.  Getting regular exercise. Most women can continue their usual exercise routine during pregnancy. Ask your health care provider what activities are safe for you.  Seeking support from family, friends, or spiritual resources.  If you cannot be together in person, you can still connect by phone calls, texts, video calls, or online messaging.  Doing relaxing activities that you enjoy, such as listening to music or reading a good book. General instructions  Follow your health care provider's instructions on taking medicines. Some medicines may not be safe to take during pregnancy.  Ask for help if you have counseling or nutritional needs. Your health care provider can offer advice or refer you to resources or specialists who can help you with various needs.  Keep all follow-up visits. This is important. This includes visits before and after you have your baby. Questions to ask your health care team  What should I do if I have COVID-19 symptoms?  What are the side effects that can occur after receiving any of the available COVID-19 vaccines?  How will COVID-19 affect my prenatal care visits, tests and scans, labor and delivery, and postpartum care?  What are the risks of COVID-19 to me and the potential risks to my unborn baby or infant?  How do vaccines pass antibodies to my unborn baby?  Should I plan to breastfeed my baby?  Where can I find mental health resources?  Where can I find support if I have financial concerns? Where to find more information  CDC: KVTVnet.com.cy  World Health Organization [facility]): CommodityPost.es  [employer] of [employer]st[employer]ians and [employer]necologists (ACOG): www.acog.org Contact a health care provider if:  You have signs and symptoms of infection, including a fever or cough. ? Tell your health care team that you think you may have a COVID-19 infection and that you are pregnant.  You have strong emotions, such as sadness or anxiety.  You feel unsafe in your home and need help finding a safe place to live.  You have bloody or watery vaginal discharge or vaginal bleeding. Get help right away if:  You have signs or symptoms of labor  before 37 weeks of pregnancy. These include: ? Contractions that are 5 minutes or less apart, or that increase in frequency, intensity, or length. ? Sudden, sharp pain in the abdomen or in the lower back. ? A gush or trickle of fluid from your vagina.  You have signs of more serious illness, such as: ? Trouble breathing. ? Chest pain. ? A fever of 102.42F (39C) or higher that does not go away. ? Vomiting every time you drink fluids. ? Feeling extremely weak. ? Fainting. These symptoms may represent a serious problem that is an emergency. Do not wait to see if the symptoms will go away. Get medical help right away. Call your local emergency services (911 in the [place] Do not drive yourself to the hospital. Summary  Pregnant women and women who were recently pregnant are at an increased risk for severe illness from COVID-19.  Take precautions to protect yourself and your baby. Wear a mask. Wash hands often. Avoid touching your mouth, face, eyes, or nose before washing hands. Avoid large groups of people and stay away from people who are sick.  If you think you have a COVID-19 infection, contact your health care provider right away. Tell your health care provider that you think you have COVID-19 and that you are pregnant.  If you have COVID-19, special precautions may be taken during pregnancy, labor and delivery, and after delivery. This information is not intended to replace advice given to you by your health care provider. Make sure you discuss any questions you have with your health care provider. Document Revised: 05/20/2020 Document Reviewed: 05/20/2020 Elsevier Patient Education  2021 [employer].

## 2020-09-30 ENCOUNTER — Ambulatory Visit (INDEPENDENT_AMBULATORY_CARE_PROVIDER_SITE_OTHER): Payer: No Typology Code available for payment source | Admitting: Certified Nurse Midwife

## 2020-09-30 ENCOUNTER — Other Ambulatory Visit: Payer: Self-pay

## 2020-09-30 ENCOUNTER — Other Ambulatory Visit: Payer: No Typology Code available for payment source

## 2020-09-30 ENCOUNTER — Encounter: Payer: Self-pay | Admitting: Certified Nurse Midwife

## 2020-09-30 VITALS — BP 109/74 | HR 76 | Wt 167.0 lb

## 2020-09-30 DIAGNOSIS — Z3A29 29 weeks gestation of pregnancy: Secondary | ICD-10-CM

## 2020-09-30 DIAGNOSIS — Z3403 Encounter for supervision of normal first pregnancy, third trimester: Secondary | ICD-10-CM

## 2020-09-30 LAB — POCT URINALYSIS DIPSTICK OB
Bilirubin, UA: NEGATIVE
Blood, UA: NEGATIVE
Glucose, UA: NEGATIVE
Leukocytes, UA: NEGATIVE
Nitrite, UA: NEGATIVE
POC,PROTEIN,UA: NEGATIVE
Spec Grav, UA: 1.01 (ref 1.010–1.025)
Urobilinogen, UA: 0.2 E.U./dL
pH, UA: 6 (ref 5.0–8.0)

## 2020-09-30 NOTE — Progress Notes (Signed)
ROB doing well. Feels good movement. Glucose today/RPR/CPC. Pt declined Tdap. Discussed with her, recommend that she take a look at [facility] website. She may take next visit. Blood transfusion consent signed and reviewed.RSB reviewed, see check list. Breast pump information form given. Information given on [facility], thinks she will use pill. Discussed POP with nursing. She verbalizes and agree. U/s scheduled on 24th for growth due to poor weight gain. Follow up 2 wk with Marcelino Duster.   Doreene Burke, CNM

## 2020-09-30 NOTE — Patient Instructions (Signed)
Oral Glucose Tolerance Test During Pregnancy Why am I having this test? The oral glucose tolerance test (OGTT) is done to check how your body processes blood sugar (glucose). This is one of several tests used to diagnose diabetes that develops during pregnancy (gestational diabetes mellitus). Gestational diabetes is a short-term form of diabetes that some women develop while they are pregnant. It usually occurs during the second trimester of pregnancy and goes away after delivery. Testing, or screening, for gestational diabetes usually occurs at weeks 24-28 of pregnancy. You may have the OGTT test after having a 1-hour glucose screening test if the results from that test indicate that you may have gestational diabetes. This test may also be needed if:  You have a history of gestational diabetes.  There is a history of giving birth to very large babies or of losing pregnancies (having stillbirths).  You have signs and symptoms of diabetes, such as: ? Changes in your eyesight. ? Tingling or numbness in your hands or feet. ? Changes in hunger, thirst, and urination, and these are not explained by your pregnancy. What is being tested? This test measures the amount of glucose in your blood at different times during a period of 3 hours. This shows how well your body can process glucose. What kind of sample is taken? Blood samples are required for this test. They are usually collected by inserting a needle into a blood vessel.   How do I prepare for this test?  For 3 days before your test, eat normally. Have plenty of carbohydrate-rich foods.  Follow instructions from your health care provider about: ? Eating or drinking restrictions on the day of the test. You may be asked not to eat or drink anything other than water (to fast) starting 8-10 hours before the test. ? Changing or stopping your regular medicines. Some medicines may interfere with this test. Tell a health care provider about:  All  medicines you are taking, including vitamins, herbs, eye drops, creams, and over-the-counter medicines.  Any blood disorders you have.  Any surgeries you have had.  Any medical conditions you have. What happens during the test? First, your blood glucose will be measured. This is referred to as your fasting blood glucose because you fasted before the test. Then, you will drink a glucose solution that contains a certain amount of glucose. Your blood glucose will be measured again 1, 2, and 3 hours after you drink the solution. This test takes about 3 hours to complete. You will need to stay at the testing location during this time. During the testing period:  Do not eat or drink anything other than the glucose solution.  Do not exercise.  Do not use any products that contain nicotine or tobacco, such as cigarettes, e-cigarettes, and chewing tobacco. These can affect your test results. If you need help quitting, ask your health care provider. The testing procedure may vary among health care providers and hospitals. How are the results reported? Your results will be reported as milligrams of glucose per deciliter of blood (mg/dL) or millimoles per liter (mmol/L). There is more than one source for screening and diagnosis reference values used to diagnose gestational diabetes. Your health care provider will compare your results to normal values that were established after testing a large group of people (reference values). Reference values may vary among labs and hospitals. For this test (Carpenter-Coustan), reference values are:  Fasting: 95 mg/dL (5.3 mmol/L).  1 hour: 180 mg/dL (10.0 mmol/L).  2 hour:   155 mg/dL (8.6 mmol/L).  3 hour: 140 mg/dL (7.8 mmol/L). What do the results mean? Results below the reference values are considered normal. If two or more of your blood glucose levels are at or above the reference values, you may be diagnosed with gestational diabetes. If only one level is  high, your health care provider may suggest repeat testing or other tests to confirm a diagnosis. Talk with your health care provider about what your results mean. Questions to ask your health care provider Ask your health care provider, or the department that is doing the test:  When will my results be ready?  How will I get my results?  What are my treatment options?  What other tests do I need?  What are my next steps? Summary  The oral glucose tolerance test (OGTT) is one of several tests used to diagnose diabetes that develops during pregnancy (gestational diabetes mellitus). Gestational diabetes is a short-term form of diabetes that some women develop while they are pregnant.  You may have the OGTT test after having a 1-hour glucose screening test if the results from that test show that you may have gestational diabetes. You may also have this test if you have any symptoms or risk factors for this type of diabetes.  Talk with your health care provider about what your results mean. This information is not intended to replace advice given to you by your health care provider. Make sure you discuss any questions you have with your health care provider. Document Revised: 12/14/2019 Document Reviewed: 12/14/2019 Elsevier Patient Education  2021 Elsevier Inc.  

## 2020-10-01 LAB — CBC
Hematocrit: 35.4 % (ref 34.0–46.6)
Hemoglobin: 12.1 g/dL (ref 11.1–15.9)
MCH: 29.8 pg (ref 26.6–33.0)
MCHC: 34.2 g/dL (ref 31.5–35.7)
MCV: 87 fL (ref 79–97)
Platelets: 247 10*3/uL (ref 150–450)
RBC: 4.06 x10E6/uL (ref 3.77–5.28)
RDW: 13 % (ref 11.7–15.4)
WBC: 12.8 10*3/uL — ABNORMAL HIGH (ref 3.4–10.8)

## 2020-10-01 LAB — RPR: RPR Ser Ql: NONREACTIVE

## 2020-10-01 LAB — GLUCOSE, 1 HOUR GESTATIONAL: Gestational Diabetes Screen: 140 mg/dL — ABNORMAL HIGH (ref 65–139)

## 2020-10-04 ENCOUNTER — Other Ambulatory Visit: Payer: Self-pay | Admitting: Certified Nurse Midwife

## 2020-10-04 DIAGNOSIS — R7309 Other abnormal glucose: Secondary | ICD-10-CM

## 2020-10-04 DIAGNOSIS — Z3403 Encounter for supervision of normal first pregnancy, third trimester: Secondary | ICD-10-CM

## 2020-10-04 DIAGNOSIS — Z131 Encounter for screening for diabetes mellitus: Secondary | ICD-10-CM

## 2020-10-04 NOTE — Progress Notes (Signed)
GTT ordered, see chart.    Melinda Wells, CNM [facility], [facility] 10/04/20 11:54 AM

## 2020-10-09 ENCOUNTER — Other Ambulatory Visit: Payer: No Typology Code available for payment source

## 2020-10-09 ENCOUNTER — Other Ambulatory Visit: Payer: Self-pay

## 2020-10-09 DIAGNOSIS — Z131 Encounter for screening for diabetes mellitus: Secondary | ICD-10-CM

## 2020-10-09 DIAGNOSIS — Z3403 Encounter for supervision of normal first pregnancy, third trimester: Secondary | ICD-10-CM

## 2020-10-09 DIAGNOSIS — R7309 Other abnormal glucose: Secondary | ICD-10-CM

## 2020-10-10 ENCOUNTER — Other Ambulatory Visit: Payer: Self-pay

## 2020-10-10 ENCOUNTER — Ambulatory Visit
Admission: RE | Admit: 2020-10-10 | Discharge: 2020-10-10 | Disposition: A | Discharge status: Home / Self Care | Payer: No Typology Code available for payment source | Source: Ambulatory Visit | Attending: Certified Nurse Midwife | Admitting: Certified Nurse Midwife

## 2020-10-10 DIAGNOSIS — O2613 Low weight gain in pregnancy, third trimester: Secondary | ICD-10-CM | POA: Diagnosis not present

## 2020-10-10 DIAGNOSIS — Z3A28 28 weeks gestation of pregnancy: Secondary | ICD-10-CM | POA: Diagnosis not present

## 2020-10-10 DIAGNOSIS — O2610 Low weight gain in pregnancy, unspecified trimester: Secondary | ICD-10-CM

## 2020-10-10 LAB — GESTATIONAL GLUCOSE TOLERANCE
Glucose, Fasting: 87 mg/dL (ref 65–94)
Glucose, GTT - 1 Hour: 151 mg/dL (ref 65–179)
Glucose, GTT - 2 Hour: 129 mg/dL (ref 65–154)
Glucose, GTT - 3 Hour: 127 mg/dL (ref 65–139)

## 2020-10-14 ENCOUNTER — Other Ambulatory Visit: Payer: Self-pay

## 2020-10-14 ENCOUNTER — Encounter: Payer: Self-pay | Admitting: Certified Nurse Midwife

## 2020-10-14 ENCOUNTER — Ambulatory Visit (INDEPENDENT_AMBULATORY_CARE_PROVIDER_SITE_OTHER): Payer: No Typology Code available for payment source | Admitting: Certified Nurse Midwife

## 2020-10-14 ENCOUNTER — Encounter: Payer: No Typology Code available for payment source | Admitting: Certified Nurse Midwife

## 2020-10-14 VITALS — BP 120/79 | HR 88 | Wt 166.9 lb

## 2020-10-14 DIAGNOSIS — Z23 Encounter for immunization: Secondary | ICD-10-CM

## 2020-10-14 DIAGNOSIS — Z3A31 31 weeks gestation of pregnancy: Secondary | ICD-10-CM

## 2020-10-14 DIAGNOSIS — Z3483 Encounter for supervision of other normal pregnancy, third trimester: Secondary | ICD-10-CM

## 2020-10-14 DIAGNOSIS — Z1159 Encounter for screening for other viral diseases: Secondary | ICD-10-CM

## 2020-10-14 DIAGNOSIS — Z3403 Encounter for supervision of normal first pregnancy, third trimester: Secondary | ICD-10-CM

## 2020-10-14 NOTE — Progress Notes (Signed)
I have seen, interviewed, and examined the patient in conjunction with the [employer] [facility] [employer] and affirm the diagnosis and management plan.   Gunnar Bulla, CNM [facility], [facility] 10/14/20 4:12 PM

## 2020-10-14 NOTE — Progress Notes (Signed)
OB-Pt present for routine prenatal care. Pt stated having vaginal pressure and braxton hick contractions.

## 2020-10-14 NOTE — Patient Instructions (Addendum)
Fetal Movement Counts Patient Name: ________________________________________________ Patient Due Date: ____________________  What is a fetal movement count? A fetal movement count is the number of times that you feel your baby move during a certain amount of time. This may also be called a fetal kick count. A fetal movement count is recommended for every pregnant woman. You may be asked to start counting fetal movements as early as week 28 of your pregnancy. Pay attention to when your baby is most active. You may notice your baby's sleep and wake cycles. You may also notice things that make your baby move more. You should do a fetal movement count:  When your baby is normally most active.  At the same time each day. A good time to count movements is while you are resting, after having something to eat and drink. How do I count fetal movements? 1. Find a quiet, comfortable area. Sit, or lie down on your side. 2. Write down the date, the start time and stop time, and the number of movements that you felt between those two times. Take this information with you to your health care visits. 3. Write down your start time when you feel the first movement. 4. Count kicks, flutters, swishes, rolls, and jabs. You should feel at least 10 movements. 5. You may stop counting after you have felt 10 movements, or if you have been counting for 2 hours. Write down the stop time. 6. If you do not feel 10 movements in 2 hours, contact your health care provider for further instructions. Your health care provider may want to do additional tests to assess your baby's well-being. Contact a health care provider if:  You feel fewer than 10 movements in 2 hours.  Your baby is not moving like he or she usually does. Date: ____________ Start time: ____________ Stop time: ____________ Movements: ____________ Date: ____________ Start time: ____________ Stop time: ____________ Movements: ____________ Date: ____________  Start time: ____________ Stop time: ____________ Movements: ____________ Date: ____________ Start time: ____________ Stop time: ____________ Movements: ____________ Date: ____________ Start time: ____________ Stop time: ____________ Movements: ____________ Date: ____________ Start time: ____________ Stop time: ____________ Movements: ____________ Date: ____________ Start time: ____________ Stop time: ____________ Movements: ____________ Date: ____________ Start time: ____________ Stop time: ____________ Movements: ____________ Date: ____________ Start time: ____________ Stop time: ____________ Movements: ____________ This information is not intended to replace advice given to you by your health care provider. Make sure you discuss any questions you have with your health care provider. Document Revised: 02/23/2019 Document Reviewed: 02/23/2019 Elsevier Patient Education  [place].   Rosen's Emergency Medicine: Concepts and Clinical Practice (9th ed., pp. 2296- 2312). Elsevier.">  Braxton Hicks Contractions Contractions of the uterus can occur throughout pregnancy, but they are not always a sign that you are in labor. You may have practice contractions called Braxton Hicks contractions. These false labor contractions are sometimes confused with true labor. What are [place] contractions? Braxton Hicks contractions are tightening movements that occur in the muscles of the uterus before labor. Unlike true labor contractions, these contractions do not result in opening (dilation) and thinning of the cervix. Toward the end of pregnancy (32-34 weeks), Braxton Hicks contractions can happen more often and may become stronger. These contractions are sometimes difficult to tell apart from true labor because they can be very uncomfortable. You should not feel embarrassed if you go to the hospital with false labor. Sometimes, the only way to tell if you are in true labor is  for your health care  provider to look for changes in the cervix. The health care provider will do a physical exam and may monitor your contractions. If you are not in true labor, the exam should show that your cervix is not dilating and your water has not broken. If there are no other health problems associated with your pregnancy, it is completely safe for you to be sent home with false labor. You may continue to have Braxton Hicks contractions until you go into true labor. How to tell the difference between true labor and false labor True labor  Contractions last 30-70 seconds.  Contractions become very regular.  Discomfort is usually felt in the top of the uterus, and it spreads to the lower abdomen and low back.  Contractions do not go away with walking.  Contractions usually become more intense and increase in frequency.  The cervix dilates and gets thinner. False labor  Contractions are usually shorter and not as strong as true labor contractions.  Contractions are usually irregular.  Contractions are often felt in the front of the lower abdomen and in the groin.  Contractions may go away when you walk around or change positions while lying down.  Contractions get weaker and are shorter-lasting as time goes on.  The cervix usually does not dilate or become thin. Follow these instructions at home:  Take over-the-counter and prescription medicines only as told by your health care provider.  Keep up with your usual exercises and follow other instructions from your health care provider.  Eat and drink lightly if you think you are going into labor.  If Braxton Hicks contractions are making you uncomfortable: ? Change your position from lying down or resting to walking, or change from walking to resting. ? Sit and rest in a tub of warm water. ? Drink enough fluid to keep your urine pale yellow. Dehydration may cause these contractions. ? Do slow and deep breathing several times an hour.  Keep  all follow-up prenatal visits as told by your health care provider. This is important.   Contact a health care provider if:  You have a fever.  You have continuous pain in your abdomen. Get help right away if:  Your contractions become stronger, more regular, and closer together.  You have fluid leaking or gushing from your vagina.  You pass blood-tinged mucus (bloody show).  You have bleeding from your vagina.  You have low back pain that you never had before.  You feel your baby's head pushing down and causing pelvic pressure.  Your baby is not moving inside you as much as it used to. Summary  Contractions that occur before labor are called Braxton Hicks contractions, false labor, or practice contractions.  Braxton Hicks contractions are usually shorter, weaker, farther apart, and less regular than true labor contractions. True labor contractions usually become progressively stronger and regular, and they become more frequent.  Manage discomfort from [facility] contractions by changing position, resting in a warm bath, drinking plenty of water, or practicing deep breathing. This information is not intended to replace advice given to you by your health care provider. Make sure you discuss any questions you have with your health care provider. Document Revised: 06/18/2017 Document Reviewed: 11/19/2016 Elsevier Patient Education  2021 [employer].   Third Trimester of Pregnancy  The third trimester of pregnancy is from week 28 through week 40. This is also called months 7 through 9. This trimester is when your unborn baby (fetus)  is growing very fast. At the end of the ninth month, the unborn baby is about 20 inches long. It weighs about 6-10 pounds. Body changes during your third trimester Your body continues to go through many changes during this time. The changes vary and generally return to normal after the baby is born. Physical changes  Your weight will continue to  increase. You may gain 25-35 pounds (11-16 kg) by the end of the pregnancy. If you are underweight, you may gain 28-40 lb (about 13-18 kg). If you are overweight, you may gain 15-25 lb (about 7-11 kg).  You may start to get stretch marks on your hips, belly (abdomen), and breasts.  Your breasts will continue to grow and may hurt. A yellow fluid (colostrum) may leak from your breasts. This is the first milk you are making for your baby.  You may have changes in your hair.  Your belly button may stick out.  You may have more swelling in your hands, face, or ankles. Health changes  You may have heartburn.  You may have trouble pooping (constipation).  You may get hemorrhoids. These are swollen veins in the butt that can itch or get painful.  You may have swollen veins (varicose veins) in your legs.  You may have more body aches in the pelvis, back, or thighs.  You may have more tingling or numbness in your hands, arms, and legs. The skin on your belly may also feel numb.  You may feel short of breath as your womb (uterus) gets bigger. Other changes  You may pee (urinate) more often.  You may have more problems sleeping.  You may notice the unborn baby "dropping," or moving lower in your belly.  You may have more discharge coming from your vagina.  Your joints may feel loose, and you may have pain around your pelvic bone. Follow these instructions at home: Medicines  Take over-the-counter and prescription medicines only as told by your doctor. Some medicines are not safe during pregnancy.  Take a prenatal vitamin that contains at least 600 micrograms (mcg) of folic acid. Eating and drinking  Eat healthy meals that include: ? Fresh fruits and vegetables. ? Whole grains. ? Good sources of protein, such as meat, eggs, or tofu. ? Low-fat dairy products.  Avoid raw meat and unpasteurized juice, milk, and cheese. These carry germs that can harm you and your baby.  Eat 4 or  5 small meals rather than 3 large meals a day.  You may need to take these actions to prevent or treat trouble pooping: ? Drink enough fluids to keep your pee (urine) pale yellow. ? Eat foods that are high in fiber. These include beans, whole grains, and fresh fruits and vegetables. ? Limit foods that are high in fat and sugar. These include fried or sweet foods. Activity  Exercise only as told by your doctor. Stop exercising if you start to have cramps in your womb.  Avoid heavy lifting.  Do not exercise if it is too hot or too humid, or if you are in a place of great height (high altitude).  If you choose to, you may have sex unless your doctor tells you not to. Relieving pain and discomfort  Take breaks often, and rest with your legs raised (elevated) if you have leg cramps or low back pain.  Take warm water baths (sitz baths) to soothe pain or discomfort caused by hemorrhoids. Use hemorrhoid cream if your doctor approves.  Wear a good   support bra if your breasts are tender.  If you develop bulging, swollen veins in your legs: ? Wear support hose as told by your doctor. ? Raise your feet for 15 minutes, 3-4 times a day. ? Limit salt in your food. Safety  Talk to your doctor before traveling far distances.  Do not use hot tubs, steam rooms, or saunas.  Wear your seat belt at all times when you are in a car.  Talk with your doctor if someone is hurting you or yelling at you a lot. Preparing for your baby's arrival To prepare for the arrival of your baby:  Take prenatal classes.  Visit the hospital and tour the maternity area.  Buy a rear-facing car seat. Learn how to install it in your car.  Prepare the baby's room. Take out all pillows and stuffed animals from the baby's crib. General instructions  Avoid cat litter boxes and soil used by cats. These carry germs that can cause harm to the baby and can cause a loss of your baby by miscarriage or stillbirth.  Do not  douche or use tampons. Do not use scented sanitary pads.  Do not smoke or use any products that contain nicotine or tobacco. If you need help quitting, ask your doctor.  Do not drink alcohol.  Do not use herbal medicines, illegal drugs, or medicines that were not approved by your doctor. Chemicals in these products can affect your baby.  Keep all follow-up visits. This is important. Where to find more information  American Pregnancy Association: americanpregnancy.org  [employer]: www.acog.org  Office on Women's Health: KeywordPortfolios.com.br Contact a doctor if:  You have a fever.  You have mild cramps or pressure in your lower belly.  You have a nagging pain in your belly area.  You vomit, or you have watery poop (diarrhea).  You have bad-smelling fluid coming from your vagina.  You have pain when you pee, or your pee smells bad.  You have a headache that does not go away when you take medicine.  You have changes in how you see, or you see spots in front of your eyes. Get help right away if:  Your water breaks.  You have regular contractions that are less than 5 minutes apart.  You are spotting or bleeding from your vagina.  You have very bad belly cramps or pain.  You have trouble breathing.  You have chest pain.  You faint.  You have not felt the baby move for the amount of time told by your doctor.  You have new or increased pain, swelling, or redness in an arm or leg. Summary  The third trimester is from week 28 through week 40 (months 7 through 9). This is the time when your unborn baby is growing very fast.  During this time, your discomfort may increase as you gain weight and as your baby grows.  Get ready for your baby to arrive by taking prenatal classes, buying a rear-facing car seat, and preparing the baby's room.  Get help right away if you are bleeding from your vagina, you have chest pain and  trouble breathing, or you have not felt the baby move for the amount of time told by your doctor. This information is not intended to replace advice given to you by your health care provider. Make sure you discuss any questions you have with your health care provider. Document Revised: 12/13/2019 Document Reviewed: 10/19/2019 Elsevier Patient Education  2021 Elsevier  Inc.  

## 2020-10-14 NOTE — Progress Notes (Signed)
ROB- Doing well overall, reports clear/white discharge. No other concerns. Reassurance offered. Breastfeeding teaching, birth control, and pregnancy overview/plan completed today, see chart. Third trimester handouts given. Anticipatory guidance regarding course of prenatal care. Reviewed red flag symptoms and when to call. RTC x 2 weeks for ROB with [name]; RTC x 4 weeks for ROB with JML or sooner if needed.   Juliann Pares, Student-MidWife [employer] 10/14/20 3:43 PM

## 2020-10-24 ENCOUNTER — Encounter: Payer: Self-pay | Admitting: Certified Nurse Midwife

## 2020-10-28 ENCOUNTER — Ambulatory Visit (INDEPENDENT_AMBULATORY_CARE_PROVIDER_SITE_OTHER): Payer: No Typology Code available for payment source | Admitting: Certified Nurse Midwife

## 2020-10-28 ENCOUNTER — Other Ambulatory Visit: Payer: Self-pay

## 2020-10-28 ENCOUNTER — Encounter: Payer: Self-pay | Admitting: Certified Nurse Midwife

## 2020-10-28 VITALS — BP 113/76 | HR 76 | Wt 171.7 lb

## 2020-10-28 DIAGNOSIS — Z3483 Encounter for supervision of other normal pregnancy, third trimester: Secondary | ICD-10-CM | POA: Diagnosis not present

## 2020-10-28 DIAGNOSIS — Z3A33 33 weeks gestation of pregnancy: Secondary | ICD-10-CM

## 2020-10-28 DIAGNOSIS — Z23 Encounter for immunization: Secondary | ICD-10-CM | POA: Diagnosis not present

## 2020-10-28 DIAGNOSIS — Z0289 Encounter for other administrative examinations: Secondary | ICD-10-CM

## 2020-10-28 LAB — POCT URINALYSIS DIPSTICK OB
Bilirubin, UA: NEGATIVE
Blood, UA: NEGATIVE
Glucose, UA: NEGATIVE
Ketones, UA: NEGATIVE
Leukocytes, UA: NEGATIVE
Nitrite, UA: NEGATIVE
POC,PROTEIN,UA: NEGATIVE
Spec Grav, UA: 1.015 (ref 1.010–1.025)
Urobilinogen, UA: 0.2 E.U./dL
pH, UA: 6.5 (ref 5.0–8.0)

## 2020-10-28 MED ORDER — TETANUS-DIPHTH-ACELL PERTUSSIS 5-2.5-18.5 LF-MCG/0.5 IM SUSY
0.5000 mL | PREFILLED_SYRINGE | Freq: Once | INTRAMUSCULAR | Status: AC
  Administered 2020-10-28: 0.5 mL via INTRAMUSCULAR

## 2020-10-28 NOTE — Progress Notes (Signed)
OB-Pt present for routine prenatal care. Pt c/o lower abd pressure.

## 2020-10-28 NOTE — Patient Instructions (Signed)

## 2020-10-28 NOTE — Progress Notes (Signed)
ROB doing well. Feels good movement. Kick counts discussed. Birth plan reviewed. Copy made to scan to chart. Pt requesting natural labor, is undecided about epidural at this time. Dad to cut core, she is fine with hep lock. She has decided to take TDap. . Follow up 2 wk with Marcelino Duster for ROB.   Doreene Burke, CNM

## 2020-10-28 NOTE — Addendum Note (Signed)
Addended by: Silvano Bilis on: 10/28/2020 03:52 PM   Modules accepted: Orders

## 2020-11-06 ENCOUNTER — Telehealth: Payer: Self-pay | Admitting: Certified Nurse Midwife

## 2020-11-06 NOTE — Telephone Encounter (Signed)
Pt called to check on the status of her FMLA paperwork, she said she does have mychart if you want to send her a message. Please Advise.

## 2020-11-08 NOTE — Telephone Encounter (Signed)
Pt is aware via my-chart.

## 2020-11-11 ENCOUNTER — Other Ambulatory Visit: Payer: Self-pay

## 2020-11-11 ENCOUNTER — Ambulatory Visit (INDEPENDENT_AMBULATORY_CARE_PROVIDER_SITE_OTHER): Payer: No Typology Code available for payment source | Admitting: Certified Nurse Midwife

## 2020-11-11 VITALS — BP 102/68 | HR 86 | Wt 171.0 lb

## 2020-11-11 DIAGNOSIS — O26893 Other specified pregnancy related conditions, third trimester: Secondary | ICD-10-CM

## 2020-11-11 DIAGNOSIS — R238 Other skin changes: Secondary | ICD-10-CM

## 2020-11-11 DIAGNOSIS — Z3403 Encounter for supervision of normal first pregnancy, third trimester: Secondary | ICD-10-CM

## 2020-11-11 DIAGNOSIS — Z3A35 35 weeks gestation of pregnancy: Secondary | ICD-10-CM

## 2020-11-11 LAB — POCT URINALYSIS DIPSTICK OB
Bilirubin, UA: NEGATIVE
Blood, UA: NEGATIVE
Glucose, UA: NEGATIVE
Ketones, urine: NEGATIVE
Leukocytes, UA: NEGATIVE
Nitrite, UA: NEGATIVE
POC,PROTEIN,UA: NEGATIVE
Spec Grav, UA: 1.01 (ref 1.010–1.025)
Urobilinogen, UA: 0.2 E.U./dL
pH, UA: 6.5 (ref 5.0–8.0)

## 2020-11-11 NOTE — Progress Notes (Signed)
ROB: Doing well, no new concerns today. 

## 2020-11-11 NOTE — Progress Notes (Signed)
ROB-Reports new onset vulvar bumps after shaving. Discussed home treatments. HSV culture collected, see orders. Anticipatory guidance regarding course of prenatal care. Reviewed red flag symptoms and when to call. RTC x 1 week for 36 week cultures and ROB with [name] or sooner if needed.

## 2020-11-11 NOTE — Patient Instructions (Signed)
Fetal Movement Counts Patient Name: ________________________________________________ Patient Due Date: ____________________  What is a fetal movement count? A fetal movement count is the number of times that you feel your baby move during a certain amount of time. This may also be called a fetal kick count. A fetal movement count is recommended for every pregnant woman. You may be asked to start counting fetal movements as early as week 28 of your pregnancy. Pay attention to when your baby is most active. You may notice your baby's sleep and wake cycles. You may also notice things that make your baby move more. You should do a fetal movement count:  When your baby is normally most active.  At the same time each day. A good time to count movements is while you are resting, after having something to eat and drink. How do I count fetal movements? 1. Find a quiet, comfortable area. Sit, or lie down on your side. 2. Write down the date, the start time and stop time, and the number of movements that you felt between those two times. Take this information with you to your health care visits. 3. Write down your start time when you feel the first movement. 4. Count kicks, flutters, swishes, rolls, and jabs. You should feel at least 10 movements. 5. You may stop counting after you have felt 10 movements, or if you have been counting for 2 hours. Write down the stop time. 6. If you do not feel 10 movements in 2 hours, contact your health care provider for further instructions. Your health care provider may want to do additional tests to assess your baby's well-being. Contact a health care provider if:  You feel fewer than 10 movements in 2 hours.  Your baby is not moving like he or she usually does. Date: ____________ Start time: ____________ Stop time: ____________ Movements: ____________ Date: ____________ Start time: ____________ Stop time: ____________ Movements: ____________ Date: ____________  Start time: ____________ Stop time: ____________ Movements: ____________ Date: ____________ Start time: ____________ Stop time: ____________ Movements: ____________ Date: ____________ Start time: ____________ Stop time: ____________ Movements: ____________ Date: ____________ Start time: ____________ Stop time: ____________ Movements: ____________ Date: ____________ Start time: ____________ Stop time: ____________ Movements: ____________ Date: ____________ Start time: ____________ Stop time: ____________ Movements: ____________ Date: ____________ Start time: ____________ Stop time: ____________ Movements: ____________ This information is not intended to replace advice given to you by your health care provider. Make sure you discuss any questions you have with your health care provider. Document Revised: 02/23/2019 Document Reviewed: 02/23/2019 Elsevier Patient Education  2021 Elsevier Inc.  

## 2020-11-14 LAB — HERPES SIMPLEX VIRUS CULTURE

## 2020-11-18 ENCOUNTER — Ambulatory Visit (INDEPENDENT_AMBULATORY_CARE_PROVIDER_SITE_OTHER): Payer: No Typology Code available for payment source | Admitting: Certified Nurse Midwife

## 2020-11-18 ENCOUNTER — Encounter: Payer: Self-pay | Admitting: Certified Nurse Midwife

## 2020-11-18 ENCOUNTER — Other Ambulatory Visit: Payer: Self-pay

## 2020-11-18 VITALS — BP 118/79 | HR 93 | Wt 177.9 lb

## 2020-11-18 DIAGNOSIS — Z3483 Encounter for supervision of other normal pregnancy, third trimester: Secondary | ICD-10-CM

## 2020-11-18 DIAGNOSIS — Z3A36 36 weeks gestation of pregnancy: Secondary | ICD-10-CM

## 2020-11-18 LAB — OB RESULTS CONSOLE GC/CHLAMYDIA: Gonorrhea: NEGATIVE

## 2020-11-18 NOTE — Progress Notes (Signed)
ROB doing well. Feels good movement. Discussed GBS and cultures today. Reviewed labor precautions. Herbal prep handout given. Follow up 1 wk with Marcelino Duster for rob.   Doreene Burke, CNM

## 2020-11-18 NOTE — Progress Notes (Signed)
OB-Pt present for routine prenatal care and 36 week cultures. Pt stated that she doing well.

## 2020-11-18 NOTE — Patient Instructions (Addendum)
Common Medications Safe in Pregnancy  Acne:      Constipation:  Benzoyl Peroxide     Colace  Clindamycin      Dulcolax Suppository  Topica Erythromycin     Fibercon  Salicylic Acid      Metamucil         Miralax AVOID:        Senakot   Accutane    Cough:  Retin-A       Cough Drops  Tetracycline      Phenergan w/ Codeine if Rx  Minocycline      Robitussin (Plain & DM)  Antibiotics:     Crabs/Lice:  Ceclor       RID  Cephalosporins    AVOID:  E-Mycins      Kwell  Keflex  Macrobid/Macrodantin   Diarrhea:  Penicillin      Kao-Pectate  Zithromax      Imodium AD         PUSH FLUIDS AVOID:       Cipro     Fever:  Tetracycline      Tylenol (Regular or Extra  Minocycline       Strength)  Levaquin      Extra Strength-Do not          Exceed 8 tabs/24 hrs Caffeine:        <212m/day (equiv. To 1 cup of coffee or  approx. 3 12 oz sodas)         Gas: Cold/Hayfever:       Gas-X  Benadryl      Mylicon  Claritin       Phazyme  **Claritin-D        Chlor-Trimeton    Headaches:  Dimetapp      ASA-Free Excedrin  Drixoral-Non-Drowsy     Cold Compress  Mucinex (Guaifenasin)     Tylenol (Regular or Extra  Sudafed/Sudafed-12 Hour     Strength)  **Sudafed PE Pseudoephedrine   Tylenol Cold & Sinus     Vicks Vapor Rub  Zyrtec  **AVOID if Problems With Blood Pressure         Heartburn: Avoid lying down for at least 1 hour after meals  Aciphex      Maalox     Rash:  Milk of Magnesia     Benadryl    Mylanta       1% Hydrocortisone Cream  Pepcid  Pepcid Complete   Sleep Aids:  Prevacid      Ambien   Prilosec       Benadryl  Rolaids       Chamomile Tea  Tums (Limit 4/day)     Unisom         Tylenol PM         Warm milk-add vanilla or  Hemorrhoids:       Sugar for taste  Anusol/Anusol H.C.  (RX: Analapram 2.5%)  Sugar Substitutes:  Hydrocortisone OTC     Ok in moderation  Preparation H      Tucks        Vaseline lotion applied to tissue with  wiping    Herpes:     Throat:  Acyclovir      Oragel  Famvir  Valtrex     Vaccines:         Flu Shot Leg Cramps:       *Gardasil  Benadryl      Hepatitis A         Hepatitis B Nasal Spray:  Pneumovax  Saline Nasal Spray     Polio Booster         Tetanus Nausea:       Tuberculosis test or PPD  Vitamin B6 25 mg TID   AVOID:    Dramamine      *Gardasil  Emetrol       Live Poliovirus  Ginger Root 250 mg QID    MMR (measles, mumps &  High Complex Carbs @ Bedtime    rebella)  Sea Bands-Accupressure    Varicella (Chickenpox)  Unisom 1/2 tab TID     *No known complications           If received before Pain:         Known pregnancy;   Darvocet       Resume series after  Lortab        Delivery  Percocet    Yeast:   Tramadol      Femstat  Tylenol 3      Gyne-lotrimin  Ultram       Monistat  Vicodin           MISC:         All Sunscreens           Hair Coloring/highlights          Insect Repellant's          (Including DEET)         Mystic Tans    Group B Streptococcus Infection During Pregnancy Group B Streptococcus (GBS) is a type of bacteria that is often found in healthy people. It is commonly found in the rectum, vagina, and intestines. In people who are healthy and not pregnant, the bacteria rarely cause serious illness or complications. However, women who test positive for GBS during pregnancy can pass the bacteria to the baby during childbirth. This can cause serious infection in the baby after birth. Women with GBS may also have infections during their pregnancy or soon after childbirth. The infections include urinary tract infections (UTIs) or infections of the uterus. GBS also increases a woman's risk of complications during pregnancy, such as early labor or delivery, miscarriage, or stillbirth. Routine testing for GBS is recommended for all pregnant women. What are the causes? This condition is caused by bacteria called Streptococcus agalactiae. What increases the  risk? You may have a higher risk for GBS infection during pregnancy if you had one during a past pregnancy. What are the signs or symptoms? In most cases, GBS infection does not cause symptoms in pregnant women. If symptoms exist, they may include:  Labor that starts before the 37th week of pregnancy.  A UTI or bladder infection. This may cause a fever, frequent urination, or pain and burning during urination.  Fever during labor. There can also be a rapid heartbeat in the mother or baby. Rare but serious symptoms of a GBS infection in women include:  Blood infection (septicemia). This may cause fever, chills, or confusion.  Lung infection (pneumonia). This may cause fever, chills, cough, rapid breathing, chest pain, or difficulty breathing.  Bone, joint, skin, or soft tissue infection. How is this diagnosed? You may be screened for GBS between week 35 and week 37 of pregnancy. If you have symptoms of preterm labor, you may be screened earlier. This condition is diagnosed based on lab test results from:  A swab of fluid from the vagina and rectum.  A urine sample. How is this treated? This condition is treated with antibiotic  medicine. Antibiotic medicine may be given:  To you when you go into labor, or as soon as your water breaks. The medicines will continue until after you give birth. If you are having a cesarean delivery, you do not need antibiotics unless your water has broken.  To your baby, if he or she requires treatment. Your health care provider will check your baby to decide if he or she needs antibiotics to prevent a serious infection.   Follow these instructions at home:  Take over-the-counter and prescription medicines only as told by your health care provider.  Take your antibiotic medicine as told by your health care provider. Do not stop taking the antibiotic even if you start to feel better.  Keep all pre-birth (prenatal) visits and follow-up visits as told by  your health care provider. This is important. Contact a health care provider if:  You have pain or burning when you urinate.  You have to urinate more often than usual.  You have a fever or chills.  You develop a bad-smelling vaginal discharge. Get help right away if:  Your water breaks.  You go into labor.  You have severe pain in your abdomen.  You have difficulty breathing.  You have chest pain. These symptoms may represent a serious problem that is an emergency. Do not wait to see if the symptoms will go away. Get medical help right away. Call your local emergency services (911 in the [place] Do not drive yourself to the hospital. Summary  GBS is a type of bacteria that is common in healthy people.  During pregnancy, colonization with GBS can cause serious complications for you or your baby.  Your health care provider will screen you between 35 and 37 weeks of pregnancy to determine if you are colonized with GBS.  If you are colonized with GBS during pregnancy, your health care provider will recommend antibiotics through an IV during labor.  After delivery, your baby will be evaluated for complications related to potential GBS infection and may require antibiotics to prevent a serious infection. This information is not intended to replace advice given to you by your health care provider. Make sure you discuss any questions you have with your health care provider. Document Revised: 05/07/2020 Document Reviewed: 01/30/2019 Elsevier Patient Education  [place].

## 2020-11-20 LAB — STREP GP B NAA: Strep Gp B NAA: NEGATIVE

## 2020-11-20 LAB — GC/CHLAMYDIA PROBE AMP
Chlamydia trachomatis, NAA: NEGATIVE
Neisseria Gonorrhoeae by PCR: NEGATIVE

## 2020-11-25 ENCOUNTER — Other Ambulatory Visit: Payer: Self-pay

## 2020-11-25 ENCOUNTER — Encounter: Payer: Self-pay | Admitting: Certified Nurse Midwife

## 2020-11-25 ENCOUNTER — Ambulatory Visit (INDEPENDENT_AMBULATORY_CARE_PROVIDER_SITE_OTHER): Payer: No Typology Code available for payment source | Admitting: Certified Nurse Midwife

## 2020-11-25 VITALS — BP 110/72 | HR 70 | Wt 178.6 lb

## 2020-11-25 DIAGNOSIS — Z3A37 37 weeks gestation of pregnancy: Secondary | ICD-10-CM

## 2020-11-25 DIAGNOSIS — Z3403 Encounter for supervision of normal first pregnancy, third trimester: Secondary | ICD-10-CM

## 2020-11-25 LAB — POCT URINALYSIS DIPSTICK OB
Bilirubin, UA: NEGATIVE
Blood, UA: NEGATIVE
Glucose, UA: NEGATIVE
Ketones, UA: NEGATIVE
Leukocytes, UA: NEGATIVE
Nitrite, UA: NEGATIVE
Spec Grav, UA: 1.02 (ref 1.010–1.025)
Urobilinogen, UA: 0.2 E.U./dL
pH, UA: 6 (ref 5.0–8.0)

## 2020-11-25 NOTE — Progress Notes (Signed)
ROB: Doing well. No new concerns today.

## 2020-11-25 NOTE — Patient Instructions (Signed)
First Stage of Labor Labor is your body's natural process of moving your baby and other structures, including the placenta and umbilical cord, out of your uterus. There are three stages of labor. How long each stage lasts is different for every woman. But certain events happen during each stage that are the same for everyone.  The first stage starts when true labor begins. This stage ends when your cervix, which is the opening from your uterus into your vagina, is completely open (dilated).  The second stage begins when your cervix is fully dilated and you start pushing. This stage ends when your baby is born.  The third stage is the delivery of the organ that nourished your baby during pregnancy (placenta). First stage of labor As your due date gets closer, you may start to notice certain physical changes that mean labor is going to start soon. You may feel that your baby has dropped lower into your pelvis. You may experience irregular, often painless, contractions that go away when you walk around or lie down (Braxton Hicks contractions). This is also called false labor. The first stage of labor begins when you start having contractions that come at regular (evenly spaced) intervals and your cervix starts to get thinner and wider in preparation for your baby to pass through. Birth care providers measure the dilation of your cervix in centimeters (cm). One centimeter is a little less than one-half of an inch. The first stage ends when your cervix is dilated to 10 cm. The first stage of labor is divided into three phases:  Early phase.  Active phase.  Transitional phase. The length of the first stage of labor varies. It may be longer if this is your first pregnancy. You may spend most of this stage at home trying to relax and stay comfortable. How does this affect me? During the first stage of labor, you will move through three phases. What happens in the early phase?  You will start to have  regular contractions that last 30-60 seconds. Contractions may come every 5-20 minutes. Keep track of your contractions and call your birth care provider.  Your water may break during this phase.  You may notice a clear or slightly bloody discharge of mucus (mucus plug) from your vagina.  Your cervix will dilate to 3-6 cm. What happens in the active phase? The active phase usually lasts 3-5 hours. You may go to the hospital or birth center around this time. During the active phase:  Your contractions will become stronger, longer, and more uncomfortable.  Your contractions may last 45-90 seconds and come every 3-5 minutes.  You may feel lower back pain.  Your birth care providers may examine your cervix and feel your belly to find the position of your baby.  You may have a monitor strapped to your belly to measure your contractions and your baby's heart rate.  You may start using your pain management options.  Your cervix may be dilated to 6 cm and may start to dilate more quickly. What happens in the transitional phase? The transitional phase typically lasts from 30 minutes to 2 hours. At the end of this phase, your cervix will be fully dilated to 10 cm. During the transitional phase:  Contractions will get stronger and longer.  Contractions may last 60-90 seconds and come less than 2 minutes apart.  You may feel hot flashes, chills, or nausea. How does this affect my baby? During the first stage of labor, your baby will   gradually move down into your birth canal. Follow these instructions at home and in the hospital or birth center:  When labor first begins, try to stay calm. You are still in the early phase. If it is night, try to get some sleep. If it is day, try to relax and save your energy. You may want to make some calls and get ready to go to the hospital or birth center.  When you are in the early phase, try these methods to help ease discomfort: ? Deep breathing and  muscle relaxation. ? Taking a walk. ? Taking a warm bath or shower.  Drink some fluids and have a light snack if you feel like it.  Keep track of your contractions.  Based on the plan you created with your birth care provider, call when your contractions indicate it is time.  If your water breaks, note the time, color, and odor of the fluid.  When you are in the active phase, do your breathing exercises and rely on your support people and your team of birth care providers.   Contact a health care provider if:  Your contractions are strong and regular.  You have lower back pain or cramping.  Your water breaks.  You lose your mucus plug. Get help right away if you:  Have a severe headache that does not go away.  Have changes in your vision.  Have severe pain in your upper belly.  Do not feel the baby move.  Have bright red bleeding. Summary  The first stage of labor starts when true labor begins, and it ends when your cervix is dilated to 10 cm.  The first stage of labor has three phases: early, active, and transitional.  Your baby moves into the birth canal during the first stage of labor.  You may have contractions that become stronger and longer. You may also lose your mucus plug and have your water break.  Call your birth care provider when your contractions are frequent and strong enough to go to the hospital or birth center. This information is not intended to replace advice given to you by your health care provider. Make sure you discuss any questions you have with your health care provider. Document Revised: 10/27/2018 Document Reviewed: 09/19/2017 Elsevier Patient Education  2021 Elsevier Inc.   Fetal Movement Counts Patient Name: ________________________________________________ Patient Due Date: ____________________  What is a fetal movement count? A fetal movement count is the number of times that you feel your baby move during a certain amount of time.  This may also be called a fetal kick count. A fetal movement count is recommended for every pregnant woman. You may be asked to start counting fetal movements as early as week 28 of your pregnancy. Pay attention to when your baby is most active. You may notice your baby's sleep and wake cycles. You may also notice things that make your baby move more. You should do a fetal movement count:  When your baby is normally most active.  At the same time each day. A good time to count movements is while you are resting, after having something to eat and drink. How do I count fetal movements? 1. Find a quiet, comfortable area. Sit, or lie down on your side. 2. Write down the date, the start time and stop time, and the number of movements that you felt between those two times. Take this information with you to your health care visits. 3. Write down your start time   when you feel the first movement. 4. Count kicks, flutters, swishes, rolls, and jabs. You should feel at least 10 movements. 5. You may stop counting after you have felt 10 movements, or if you have been counting for 2 hours. Write down the stop time. 6. If you do not feel 10 movements in 2 hours, contact your health care provider for further instructions. Your health care provider may want to do additional tests to assess your baby's well-being. Contact a health care provider if:  You feel fewer than 10 movements in 2 hours.  Your baby is not moving like he or she usually does. Date: ____________ Start time: ____________ Stop time: ____________ Movements: ____________ Date: ____________ Start time: ____________ Stop time: ____________ Movements: ____________ Date: ____________ Start time: ____________ Stop time: ____________ Movements: ____________ Date: ____________ Start time: ____________ Stop time: ____________ Movements: ____________ Date: ____________ Start time: ____________ Stop time: ____________ Movements: ____________ Date:  ____________ Start time: ____________ Stop time: ____________ Movements: ____________ Date: ____________ Start time: ____________ Stop time: ____________ Movements: ____________ Date: ____________ Start time: ____________ Stop time: ____________ Movements: ____________ Date: ____________ Start time: ____________ Stop time: ____________ Movements: ____________ This information is not intended to replace advice given to you by your health care provider. Make sure you discuss any questions you have with your health care provider. Document Revised: 02/23/2019 Document Reviewed: 02/23/2019 Elsevier Patient Education  2021 Elsevier Inc.  

## 2020-11-25 NOTE — Progress Notes (Signed)
ROB-Doing well, except for intermittent pelvic pressure. Declines SVE. Pre-labor checklist and birth affirmations given. Anticipatory guidance regarding course of prenatal care. Reviewed red flag symptoms and when to call. RTC x 1 week for ROB with [name] or sooner if needed.

## 2020-12-02 ENCOUNTER — Ambulatory Visit (INDEPENDENT_AMBULATORY_CARE_PROVIDER_SITE_OTHER): Payer: No Typology Code available for payment source | Admitting: Certified Nurse Midwife

## 2020-12-02 ENCOUNTER — Other Ambulatory Visit: Payer: Self-pay

## 2020-12-02 ENCOUNTER — Encounter: Payer: Self-pay | Admitting: Certified Nurse Midwife

## 2020-12-02 VITALS — BP 112/79 | HR 89 | Wt 179.3 lb

## 2020-12-02 DIAGNOSIS — Z3A38 38 weeks gestation of pregnancy: Secondary | ICD-10-CM

## 2020-12-02 DIAGNOSIS — Z3483 Encounter for supervision of other normal pregnancy, third trimester: Secondary | ICD-10-CM

## 2020-12-02 LAB — POCT URINALYSIS DIPSTICK OB
Bilirubin, UA: NEGATIVE
Blood, UA: NEGATIVE
Glucose, UA: NEGATIVE
Ketones, UA: NEGATIVE
Leukocytes, UA: NEGATIVE
Nitrite, UA: NEGATIVE
Spec Grav, UA: 1.025 (ref 1.010–1.025)
Urobilinogen, UA: 0.2 E.U./dL
pH, UA: 6 (ref 5.0–8.0)

## 2020-12-02 NOTE — Progress Notes (Signed)
ROB doing well, Feels good movement. Labor precautions reviewed. Follow up 1 wk.   Doreene Burke, CNM

## 2020-12-02 NOTE — Progress Notes (Signed)
OB-Pt present for routine prenatal care. Pt c/o pelvic pain and pressure when standing.

## 2020-12-02 NOTE — Patient Instructions (Signed)

## 2020-12-06 ENCOUNTER — Encounter: Payer: Self-pay | Admitting: Certified Nurse Midwife

## 2020-12-12 ENCOUNTER — Ambulatory Visit (INDEPENDENT_AMBULATORY_CARE_PROVIDER_SITE_OTHER): Payer: No Typology Code available for payment source | Admitting: Certified Nurse Midwife

## 2020-12-12 ENCOUNTER — Encounter: Payer: Self-pay | Admitting: Certified Nurse Midwife

## 2020-12-12 ENCOUNTER — Other Ambulatory Visit: Payer: Self-pay

## 2020-12-12 VITALS — BP 121/85 | HR 89 | Wt 185.8 lb

## 2020-12-12 DIAGNOSIS — Z3A39 39 weeks gestation of pregnancy: Secondary | ICD-10-CM

## 2020-12-12 DIAGNOSIS — Z3403 Encounter for supervision of normal first pregnancy, third trimester: Secondary | ICD-10-CM

## 2020-12-12 LAB — POCT URINALYSIS DIPSTICK OB
Bilirubin, UA: NEGATIVE
Blood, UA: NEGATIVE
Glucose, UA: NEGATIVE
Ketones, UA: NEGATIVE
Leukocytes, UA: NEGATIVE
Nitrite, UA: NEGATIVE
Spec Grav, UA: 1.015 (ref 1.010–1.025)
Urobilinogen, UA: 0.2 E.U./dL
pH, UA: 6 (ref 5.0–8.0)

## 2020-12-12 NOTE — Progress Notes (Signed)
ROB: She is doing well. Having some mild cramping.

## 2020-12-12 NOTE — Patient Instructions (Signed)
Fetal Movement Counts Patient Name: ________________________________________________ Patient Due Date: ____________________  What is a fetal movement count? A fetal movement count is the number of times that you feel your baby move during a certain amount of time. This may also be called a fetal kick count. A fetal movement count is recommended for every pregnant woman. You may be asked to start counting fetal movements as early as week 28 of your pregnancy. Pay attention to when your baby is most active. You may notice your baby's sleep and wake cycles. You may also notice things that make your baby move more. You should do a fetal movement count:  When your baby is normally most active.  At the same time each day. A good time to count movements is while you are resting, after having something to eat and drink. How do I count fetal movements? 1. Find a quiet, comfortable area. Sit, or lie down on your side. 2. Write down the date, the start time and stop time, and the number of movements that you felt between those two times. Take this information with you to your health care visits. 3. Write down your start time when you feel the first movement. 4. Count kicks, flutters, swishes, rolls, and jabs. You should feel at least 10 movements. 5. You may stop counting after you have felt 10 movements, or if you have been counting for 2 hours. Write down the stop time. 6. If you do not feel 10 movements in 2 hours, contact your health care provider for further instructions. Your health care provider may want to do additional tests to assess your baby's well-being. Contact a health care provider if:  You feel fewer than 10 movements in 2 hours.  Your baby is not moving like he or she usually does. Date: ____________ Start time: ____________ Stop time: ____________ Movements: ____________ Date: ____________ Start time: ____________ Stop time: ____________ Movements: ____________ Date: ____________  Start time: ____________ Stop time: ____________ Movements: ____________ Date: ____________ Start time: ____________ Stop time: ____________ Movements: ____________ Date: ____________ Start time: ____________ Stop time: ____________ Movements: ____________ Date: ____________ Start time: ____________ Stop time: ____________ Movements: ____________ Date: ____________ Start time: ____________ Stop time: ____________ Movements: ____________ Date: ____________ Start time: ____________ Stop time: ____________ Movements: ____________ Date: ____________ Start time: ____________ Stop time: ____________ Movements: ____________ This information is not intended to replace advice given to you by your health care provider. Make sure you discuss any questions you have with your health care provider. Document Revised: 02/23/2019 Document Reviewed: 02/23/2019 Elsevier Patient Education  2021 Elsevier Inc.  

## 2020-12-13 ENCOUNTER — Inpatient Hospital Stay: Admit: 2020-12-13 | Payer: Self-pay

## 2020-12-13 NOTE — Progress Notes (Signed)
ROB-Reports irregular mild cramping. Drinking RRT and using EPO. Anticipatory guidance regarding course of postdates care. Reviewed red flag symptoms and when to call. RTC x Tuesday for NST and ROB or sooner if needed.

## 2020-12-14 ENCOUNTER — Inpatient Hospital Stay
Admission: EM | Admit: 2020-12-14 | Discharge: 2020-12-17 | DRG: 806 | Disposition: A | Discharge status: Home / Self Care | Payer: No Typology Code available for payment source | Attending: Certified Nurse Midwife | Admitting: Certified Nurse Midwife

## 2020-12-14 ENCOUNTER — Other Ambulatory Visit: Payer: Self-pay

## 2020-12-14 ENCOUNTER — Inpatient Hospital Stay: Payer: No Typology Code available for payment source | Admitting: Pediatrics

## 2020-12-14 ENCOUNTER — Encounter: Payer: Self-pay | Admitting: Obstetrics and Gynecology

## 2020-12-14 DIAGNOSIS — O4202 Full-term premature rupture of membranes, onset of labor within 24 hours of rupture: Secondary | ICD-10-CM | POA: Diagnosis not present

## 2020-12-14 DIAGNOSIS — Z3A40 40 weeks gestation of pregnancy: Secondary | ICD-10-CM | POA: Diagnosis not present

## 2020-12-14 DIAGNOSIS — O99214 Obesity complicating childbirth: Secondary | ICD-10-CM | POA: Diagnosis present

## 2020-12-14 DIAGNOSIS — Z20822 Contact with and (suspected) exposure to covid-19: Secondary | ICD-10-CM | POA: Diagnosis present

## 2020-12-14 DIAGNOSIS — Z8616 Personal history of COVID-19: Secondary | ICD-10-CM | POA: Diagnosis not present

## 2020-12-14 DIAGNOSIS — O4292 Full-term premature rupture of membranes, unspecified as to length of time between rupture and onset of labor: Principal | ICD-10-CM | POA: Diagnosis present

## 2020-12-14 LAB — ABO/RH: ABO/RH(D): O POS

## 2020-12-14 LAB — CBC
HCT: 36.4 % (ref 36.0–46.0)
Hemoglobin: 12.4 g/dL (ref 12.0–15.0)
MCH: 30.1 pg (ref 26.0–34.0)
MCHC: 34.1 g/dL (ref 30.0–36.0)
MCV: 88.3 fL (ref 80.0–100.0)
Platelets: 165 10*3/uL (ref 150–400)
RBC: 4.12 MIL/uL (ref 3.87–5.11)
RDW: 14.1 % (ref 11.5–15.5)
WBC: 11.1 10*3/uL — ABNORMAL HIGH (ref 4.0–10.5)
nRBC: 0.2 % (ref 0.0–0.2)

## 2020-12-14 LAB — RPR: RPR Ser Ql: NONREACTIVE

## 2020-12-14 LAB — TYPE AND SCREEN
ABO/RH(D): O POS
Antibody Screen: NEGATIVE

## 2020-12-14 LAB — RESP PANEL BY RT-PCR (FLU A&B, COVID) ARPGX2
Influenza A by PCR: NEGATIVE
Influenza B by PCR: NEGATIVE
SARS Coronavirus 2 by RT PCR: NEGATIVE

## 2020-12-14 LAB — RUPTURE OF MEMBRANE (ROM)PLUS: Rom Plus: POSITIVE

## 2020-12-14 MED ORDER — DIPHENHYDRAMINE HCL 50 MG/ML IJ SOLN: 12.5000 mg | INTRAMUSCULAR | Status: DC | PRN

## 2020-12-14 MED ORDER — LACTATED RINGERS IV SOLN: 500.0000 mL | Freq: Once | INTRAVENOUS | Status: DC

## 2020-12-14 MED ORDER — FENTANYL 2.5 MCG/ML W/ROPIVACAINE 0.15% IN NS 100 ML EPIDURAL (ARMC)
12.0000 mL/h | EPIDURAL | Status: DC
Start: 2020-12-14 — End: 2020-12-15
  Administered 2020-12-14: 12 mL/h via EPIDURAL

## 2020-12-14 MED ORDER — LACTATED RINGERS IV SOLN: INTRAVENOUS | Status: DC

## 2020-12-14 MED ORDER — ONDANSETRON HCL 4 MG/2ML IJ SOLN: 4.0000 mg | Freq: Four times a day (QID) | INTRAMUSCULAR | Status: DC | PRN

## 2020-12-14 MED ORDER — LIDOCAINE HCL (PF) 1 % IJ SOLN: 30.0000 mL | INTRAMUSCULAR | Status: DC | PRN

## 2020-12-14 MED ORDER — BUPIVACAINE HCL (PF) 0.25 % IJ SOLN
INTRAMUSCULAR | Status: DC | PRN
  Administered 2020-12-14: 6 mL via EPIDURAL
  Administered 2020-12-15 (×2): 5 mL via EPIDURAL

## 2020-12-14 MED ORDER — TERBUTALINE SULFATE 1 MG/ML IJ SOLN: 0.2500 mg | Freq: Once | INTRAMUSCULAR | Status: DC | PRN

## 2020-12-14 MED ORDER — LIDOCAINE-EPINEPHRINE (PF) 1.5 %-1:200000 IJ SOLN
INTRAMUSCULAR | Status: DC | PRN
  Administered 2020-12-14: 3 mL via PERINEURAL

## 2020-12-14 MED ORDER — ACETAMINOPHEN 325 MG PO TABS: 650.0000 mg | ORAL_TABLET | ORAL | Status: DC | PRN

## 2020-12-14 MED ORDER — FENTANYL 2.5 MCG/ML W/ROPIVACAINE 0.15% IN NS 100 ML EPIDURAL (ARMC)
EPIDURAL | Status: AC
  Filled 2020-12-14: qty 100

## 2020-12-14 MED ORDER — MISOPROSTOL 50MCG HALF TABLET
50.0000 ug | ORAL_TABLET | ORAL | Status: DC
  Filled 2020-12-14: qty 1

## 2020-12-14 MED ORDER — FENTANYL CITRATE (PF) 100 MCG/2ML IJ SOLN: 50.0000 ug | INTRAMUSCULAR | Status: DC | PRN

## 2020-12-14 MED ORDER — SODIUM CHLORIDE 0.9% FLUSH: 3.0000 mL | Freq: Two times a day (BID) | INTRAVENOUS | Status: DC

## 2020-12-14 MED ORDER — OXYTOCIN 10 UNIT/ML IJ SOLN
INTRAMUSCULAR | Status: AC
  Filled 2020-12-14: qty 2

## 2020-12-14 MED ORDER — SODIUM CHLORIDE 0.9 % IV SOLN: 250.0000 mL | INTRAVENOUS | Status: DC | PRN

## 2020-12-14 MED ORDER — LIDOCAINE HCL (PF) 1 % IJ SOLN
INTRAMUSCULAR | Status: AC
  Filled 2020-12-14: qty 30

## 2020-12-14 MED ORDER — EPHEDRINE 5 MG/ML INJ: 10.0000 mg | INTRAVENOUS | Status: DC | PRN

## 2020-12-14 MED ORDER — PHENYLEPHRINE 40 MCG/ML (10ML) SYRINGE FOR IV PUSH (FOR BLOOD PRESSURE SUPPORT): 80.0000 ug | PREFILLED_SYRINGE | INTRAVENOUS | Status: DC | PRN

## 2020-12-14 MED ORDER — OXYTOCIN BOLUS FROM INFUSION
333.0000 mL | Freq: Once | INTRAVENOUS | Status: AC
  Administered 2020-12-15: 333 mL via INTRAVENOUS

## 2020-12-14 MED ORDER — BUTORPHANOL TARTRATE 1 MG/ML IJ SOLN: 1.0000 mg | INTRAMUSCULAR | Status: DC | PRN

## 2020-12-14 MED ORDER — OXYTOCIN-SODIUM CHLORIDE 30-0.9 UT/500ML-% IV SOLN: 1.0000 m[IU]/min | INTRAVENOUS | Status: DC

## 2020-12-14 MED ORDER — OXYCODONE-ACETAMINOPHEN 5-325 MG PO TABS: 2.0000 | ORAL_TABLET | ORAL | Status: DC | PRN

## 2020-12-14 MED ORDER — OXYCODONE-ACETAMINOPHEN 5-325 MG PO TABS: 1.0000 | ORAL_TABLET | ORAL | Status: DC | PRN

## 2020-12-14 MED ORDER — OXYTOCIN-SODIUM CHLORIDE 30-0.9 UT/500ML-% IV SOLN
2.5000 [IU]/h | INTRAVENOUS | Status: DC
  Administered 2020-12-15: 2.5 [IU]/h via INTRAVENOUS

## 2020-12-14 MED ORDER — MISOPROSTOL 200 MCG PO TABS
ORAL_TABLET | ORAL | Status: AC
  Filled 2020-12-14: qty 4

## 2020-12-14 MED ORDER — SOD CITRATE-CITRIC ACID 500-334 MG/5ML PO SOLN: 30.0000 mL | ORAL | Status: DC | PRN

## 2020-12-14 MED ORDER — SODIUM CHLORIDE 0.9% FLUSH: 3.0000 mL | INTRAVENOUS | Status: DC | PRN

## 2020-12-14 MED ORDER — OXYTOCIN-SODIUM CHLORIDE 30-0.9 UT/500ML-% IV SOLN
INTRAVENOUS | Status: AC
  Administered 2020-12-14: 2 m[IU]/min via INTRAVENOUS
  Filled 2020-12-14: qty 1000

## 2020-12-14 MED ORDER — ZOLPIDEM TARTRATE 5 MG PO TABS: 5.0000 mg | ORAL_TABLET | Freq: Every evening | ORAL | Status: DC | PRN

## 2020-12-14 MED ORDER — LACTATED RINGERS IV SOLN
500.0000 mL | INTRAVENOUS | Status: DC | PRN
Start: 2020-12-14 — End: 2020-12-15

## 2020-12-14 MED ORDER — AMMONIA AROMATIC IN INHA
RESPIRATORY_TRACT | Status: AC
  Filled 2020-12-14: qty 10

## 2020-12-14 NOTE — Progress Notes (Signed)
Patient ID: Melinda Wells, female   DOB: 1986/04/26, 35 y.o.   MRN: [id number]  Melinda Wells is a 35 y.o. G3P0020 at [redacted]w[redacted]d by ultrasound admitted for rupture of membranes  Subjective:  Patient reports pelvic pressure with contractions, using nitrous oxide.   FOB and mother at bedside for continuous labor support.   Denies difficulty breathing or respiratory distress, chest pain, and leg pain or swelling.   Objective:  Temp:  [98.2 F (36.8 C)-98.9 F (37.2 C)] 98.5 F (36.9 C) (05/28 1922) Pulse Rate:  [62-74] 74 (05/28 1922) Resp:  [16-18] 16 (05/28 1922) BP: (122-137)/(79-90) 132/83 (05/28 1922) SpO2:  [95 %-98 %] 97 % (05/28 1922) Weight:  [84.8 kg] 84.8 kg (05/28 0837)  Fetal Wellbeing:  Category I  UC:   regular, every three (3) to four (4) minutes; soft resting tone, pitocin infusing at 6 mu/min  SVE:   Dilation: 7.5 Effacement (%): 90 Station: -1 Exam by:: Makaria Poarch CNM  Labs: Lab Results  Component Value Date   WBC 11.1 (H) 12/14/2020   HGB 12.4 12/14/2020   HCT 36.4 12/14/2020   MCV 88.3 12/14/2020   PLT 165 12/14/2020    Assessment:  Melinda Wells a 35 y.o.G3P0020 at [redacted]w[redacted]d admitted for full term premature rupture of membranes, Rh positive, GBS negative  FHR Category I  Plan:  Discussed pain management options and current level of coping with patient and family members; will consider epidural and other options and notified CNM.   Encouraged position change and use of other non-pharmacologic pain management techniques.   Reviewed red flag symptoms and when to call.   Room prepared for second stage.     [name], CNM [facility], [facility] 12/14/2020, 8:56 PM

## 2020-12-14 NOTE — Progress Notes (Signed)
Melinda Wells is a 35 y.o. G3P0020 at [redacted]w[redacted]d admitted for rupture of membranes  Subjective:   Patient standing beside bed, using nitrous oxide during contractions. Patient reports feeling pressure during contractions.  Denies difficulty breathing or respiratory distress, chest pain, and/or leg pain or swelling.  Objective:  BP 133/84 (BP Location: Right Arm)   Pulse 69   Temp 98.6 F (37 C) (Oral)   Resp 16   Ht 5\' 2"  (1.575 m)   Wt 84.8 kg Comment: 187 lbs  LMP 01/21/2020 (Approximate)   SpO2 97%   BMI 34.20 kg/m  I/O last 3 completed shifts: In: 12 [I.V.:12] Out: -  No intake/output data recorded.  FHT:  FHR: 125 bpm, variability: moderate,  accelerations:  Present,  decelerations:  Absent UC:   regular, every 4-5 minutes SVE:   Dilation: 6 Effacement (%): 90 Station: -1 Exam by:: [email or web address], RN  Labs:  Lab Results  Component Value Date   WBC 11.1 (H) 12/14/2020   HGB 12.4 12/14/2020   HCT 36.4 12/14/2020   MCV 88.3 12/14/2020   PLT 165 12/14/2020    Assessment:   35 yr. G3P0020 at [redacted]w[redacted]d for ROM  Category I  Plan:  Labor: Progressing on pitocin, increasing per protocol. Patient will go to bathroom, discussed checking cervix after.   Pain Control:  Using Nitrous oxide as needed with family support.  Anticipated MOD:  NSVD  [redacted]w[redacted]d, Student-MidWife Juliann Pares 12/14/2020, 7:11 PM

## 2020-12-14 NOTE — H&P (Signed)
Obstetric History and Physical  Melinda Wells is a 35 y.o. G3P0020 with IUP at [redacted]w[redacted]d presenting with full term premature rupture of membranes.   Patient states she has been having  irregular, every six (6) minutes contractions, none vaginal bleeding, ruptured, clear fluid membranes since 12:30 AM, with active fetal movement.    Denies difficulty breathing or respiratory distress, chest pain, dysuria, and leg pain.   Prenatal Course  Source of Care: [facility]-initial visit at 9 weeks, total visits: 14  Pregnancy complications or risks: Obesity in pregnancy, History of COVID-19  Prenatal labs and studies:  ABO, Rh: --/--/O POS Performed at [facility], [address] [place], [place] [address]  [phone]/28 0636)  Antibody: NEG (05/28 0506)  Rubella: 2.21 (10/28 0086)  Varicella: 3, 870 (10/28 0958)  RPR: Non Reactive (03/14 1039)   HBsAg: Negative (10/28 0958)   HIV: Non Reactive (10/28 0958)   PYP:PJKDTOIZ/-- (05/02 1607)  1 hr Glucola: 140 (03/14 1039)  Gestational Glucose Tolerance Test: 87, 151, 129, 127 (03/23 0809)  Genetic screening: Low risk female (11/15 1129)  Anatomy US: Complete, normal (03/24 1157)  Past Medical History:  Diagnosis Date  . Degeneration of cervical intervertebral disc   . PCOS (polycystic ovarian syndrome)     Past Surgical History:  Procedure Laterality Date  . NO PAST SURGERIES      OB History  Gravida Para Term Preterm AB Living  3       2    SAB IAB Ectopic Multiple Live Births  1            # Outcome Date GA Lbr Len/2nd Weight Sex Delivery Anes PTL Lv  3 Current           2 SAB 2021          1 AB 2013            Social History   Socioeconomic History  . Marital status: Married    Spouse name: Arleth Mccullar  . Number of children: Not on file  . Years of education: Not on file  . Highest education level: Not on file  Occupational History  . Not on file  Tobacco Use  . Smoking status: Never Smoker  . Smokeless  tobacco: Never Used  Vaping Use  . Vaping Use: Never used  Substance and Sexual Activity  . Alcohol use: Not Currently    Comment: Occasionally   . Drug use: No  . Sexual activity: Yes    Birth control/protection: None  Other Topics Concern  . Not on file  Social History Narrative  . Not on file   Social Determinants of Health   Financial Resource Strain: Not on file  Food Insecurity: Not on file  Transportation Needs: Not on file  Physical Activity: Not on file  Stress: Not on file  Social Connections: Not on file    Family History  Problem Relation Age of Onset  . Hyperlipidemia Mother   . Diabetes Maternal Grandfather     Medications Prior to Admission  Medication Sig Dispense Refill Last Dose  . CALCIUM-MAGNESIUM-ZINC PO    Past Week at Unknown time  . docusate sodium (COLACE) 100 MG capsule    Past Month at Unknown time  . folic acid (FOLVITE) 400 MCG tablet    12/13/2020 at Unknown time  . OVER THE COUNTER MEDICATION primerose   12/13/2020 at Unknown time  . Prenatal Vit-Fe Fumarate-FA (MULTIVITAMIN-PRENATAL) 27-0.8 MG TABS tablet Take 1  tablet by mouth daily at 12 noon.   12/13/2020 at Unknown time  . Vitamin D, Ergocalciferol, (DRISDOL) 1.25 MG (50000 UNIT) CAPS capsule TAKE 1 CAPSULE BY MOUTH ONCE A WEEK 13 capsule 3 Past Week at Unknown time    No Known Allergies  Review of Systems: Negative except for what is mentioned in HPI.  Physical Exam:  Temp:  [98.2 F (36.8 C)-98.9 F (37.2 C)] 98.9 F (37.2 C) (05/28 0756) Pulse Rate:  [62-71] 62 (05/28 0756) Resp:  [16] 16 (05/28 0756) BP: (122-137)/(82-87) 122/82 (05/28 0756) Weight:  [84.8 kg] 84.8 kg (05/28 0604)  GENERAL: Well-developed, well-nourished female in no acute distress.   LUNGS: Clear to auscultation bilaterally.   HEART: Regular rate and rhythm.  ABDOMEN: Soft, nontender, nondistended, gravid.  EXTREMITIES: Nontender, no edema, 2+ distal pulses.  Cervical Exam: Dilation:  1.5 Effacement (%): 80 Cervical Position: Posterior Station: -3 Presentation: Vertex  FHR Category I  Contractions: Irregular, soft resting tone   Pertinent Labs/Studies:   Results for orders placed or performed during the hospital encounter of 12/14/20 (from the past 24 hour(s))  ROM Plus ([facility] only)     Status: None   Collection Time: 12/14/20  5:06 AM  Result Value Ref Range   Rom Plus POSITIVE   Resp Panel by RT-PCR (Flu A&B, Covid)     Status: None   Collection Time: 12/14/20  5:06 AM   Specimen: Nasopharyngeal(NP) swabs in vial transport medium  Result Value Ref Range   SARS Coronavirus 2 by RT PCR NEGATIVE NEGATIVE   Influenza A by PCR NEGATIVE NEGATIVE   Influenza B by PCR NEGATIVE NEGATIVE  CBC     Status: Abnormal   Collection Time: 12/14/20  5:06 AM  Result Value Ref Range   WBC 11.1 (H) 4.0 - 10.5 K/uL   RBC 4.12 3.87 - 5.11 MIL/uL   Hemoglobin 12.4 12.0 - 15.0 g/dL   HCT 63.0 16.0 - 10.9 %   MCV 88.3 80.0 - 100.0 fL   MCH 30.1 26.0 - 34.0 pg   MCHC 34.1 30.0 - 36.0 g/dL   RDW 32.3 55.7 - 32.2 %   Platelets 165 150 - 400 K/uL   nRBC 0.2 0.0 - 0.2 %  Type and screen [facility]     Status: None   Collection Time: 12/14/20  5:06 AM  Result Value Ref Range   ABO/RH(D) O POS    Antibody Screen NEG    Sample Expiration      12/17/2020,2359 Performed at [facility], [address] [place], [place] [address]   ABO/Rh     Status: None   Collection Time: 12/14/20  6:36 AM  Result Value Ref Range   ABO/RH(D)      O POS Performed at [facility], [address] [place], [place] [address]     Assessment :  Melinda Wells is a 35 y.o. G3P0020 at [redacted]w[redacted]d being admitted for full term premature rupture of membranes, Rh positive, GBS negative  FHR Category I  Plan:  Admit to birthing suites, see orders.   Labor: Expectant management.  Induction/Augmentation as needed, per protocol.  Plan: Hopeful for vaginal birth.    Dr. Valentino Saxon notified of admission and plan of care.    Gunnar Bulla, CNM [facility], [facility] 12/14/20 8:30 AM

## 2020-12-14 NOTE — Progress Notes (Signed)
Patient ID: Melinda Wells, female   DOB: 1985/12/08, 35 y.o.   MRN: [id number]  Melinda Wells is a 35 y.o. G3P0020 at [redacted]w[redacted]d by ultrasound admitted for rupture of membranes  Subjective:  Patient ambulating in room, breathing through contractions. FOB and mother at bedside for continuous labor support.   Denies difficulty breathing or respiratory distress, chest pain, and leg pain.   Objective:  Temp:  [98.2 F (36.8 C)-98.9 F (37.2 C)] 98.7 F (37.1 C) (05/28 1227) Pulse Rate:  [62-71] 62 (05/28 0756) Resp:  [16-18] 18 (05/28 1227) BP: (122-137)/(82-90) 126/90 (05/28 1227) Weight:  [84.8 kg] 84.8 kg (05/28 0837)  Fetal Wellbeing:  Category I  UC:   regular, every three (3) to four (4) minutes per patient report; soft resting tone  SVE:   Dilation: 5.5 Effacement (%): 90 Station: -2 Exam by:: Willodean Rosenthal, CNM  Labs: Lab Results  Component Value Date   WBC 11.1 (H) 12/14/2020   HGB 12.4 12/14/2020   HCT 36.4 12/14/2020   MCV 88.3 12/14/2020   PLT 165 12/14/2020    Assessment:  Melinda Wells is a 35 y.o. G3P0020 at [redacted]w[redacted]d admitted for full term premature rupture of membranes, Rh positive, GBS negative  FHR Category I   Plan:  Encouraged position change and use of peanut ball.   Reviewed red flag symptoms and when to call.   Continue orders as written. Reassess as needed.    [name], CNM [facility], [facility] 12/14/2020, 1:23 PM

## 2020-12-14 NOTE — Progress Notes (Signed)
Patient ID: Melinda Wells, female   DOB: 12-May-1986, 35 y.o.   MRN: [id number]  Melinda Wells is a 35 y.o. G3P0020 at [redacted]w[redacted]d by ultrasound admitted for rupture of membranes  Subjective:  Patient sitting on side of bed, using nitrous oxide during contractions. Questions epidural placement due to fatigue.   FOB and mother at bedside for continuous labor.   Denies difficulty breathing or respiratory distress, chest pain, dysuria, and leg pain.   Objective:  Temp:  [98.2 F (36.8 C)-98.9 F (37.2 C)] 98.5 F (36.9 C) (05/28 1922) Pulse Rate:  [62-74] 74 (05/28 1922) Resp:  [16-18] 16 (05/28 1922) BP: (122-137)/(79-90) 132/83 (05/28 1922) SpO2:  [95 %-98 %] 97 % (05/28 1922) Weight:  [84.8 kg] 84.8 kg (05/28 0837)  Fetal Wellbeing:  Category I  UC:   regular, every four (4) to five (5) minutes; soft resting tone, pitocin infusing at  6 mu/min  SVE:   Dilation: 7.5 Effacement (%): 90 Station: -1 Exam by:: Dain Laseter CNM  Labs: Lab Results  Component Value Date   WBC 11.1 (H) 12/14/2020   HGB 12.4 12/14/2020   HCT 36.4 12/14/2020   MCV 88.3 12/14/2020   PLT 165 12/14/2020    Assessment:  Melinda Wells is a 35 y.o. G3P0020 at [redacted]w[redacted]d admitted for full term premature rupture of membranes, Rh positive, GBS negative     FHR Category I  Plan:  Encouraged patient to get epidural and take a nap; Anesthesiologist notified.   Update given to Dr. Valentino Saxon.   Will continue to increase pitocin and change position frequently once patient comfortable.   Reviewed red flag symptoms and when to call.    [name], CNM [facility], [facility] 12/14/2020, 10:34 PM

## 2020-12-14 NOTE — OB Triage Note (Signed)
Reports gush of fluid at 0030 clear with some pinkish-brown, denies odor. Endorses fetal movement, and states contractions "like light cramping" every 15 -20 minutes.

## 2020-12-14 NOTE — Anesthesia Preprocedure Evaluation (Signed)
Anesthesia Evaluation  Patient identified by MRN, date of birth, ID band Patient awake    Reviewed: Allergy & Precautions, NPO status , Patient's Chart, lab work & pertinent test results  History of Anesthesia Complications Negative for: history of anesthetic complications  Airway Mallampati: II       Dental   Pulmonary neg pulmonary ROS,    Pulmonary exam normal        Cardiovascular Exercise Tolerance: Good negative cardio ROS Normal cardiovascular exam     Neuro/Psych  H/o cervical degenerative disc disease negative psych ROS   GI/Hepatic negative GI ROS, Neg liver ROS,   Endo/Other  negative endocrine ROS  Renal/GU negative Renal ROS  negative genitourinary   Musculoskeletal  (+) Arthritis ,   Abdominal (+) + obese (BMI 34),   Peds negative pediatric ROS (+)  Hematology negative hematology ROS (+)   Anesthesia Other Findings Gravid uterus  Reproductive/Obstetrics (+) Pregnancy                             Anesthesia Physical Anesthesia Plan  ASA: II  Anesthesia Plan: Epidural   Post-op Pain Management:    Induction:   PONV Risk Score and Plan:   Airway Management Planned:   Additional Equipment: None  Intra-op Plan:   Post-operative Plan:   Informed Consent: I have reviewed the patients History and Physical, chart, labs and discussed the procedure including the risks, benefits and alternatives for the proposed anesthesia with the patient or authorized representative who has indicated his/her understanding and acceptance.       Plan Discussed with:   Anesthesia Plan Comments:         Anesthesia Quick Evaluation

## 2020-12-14 NOTE — Progress Notes (Addendum)
Melinda Wells is a 35 y.o. G3P0020 at [redacted]w[redacted]d by admitted for PROM  Subjective: Patient laying in bed, with family rubbing back, and breathing through contractions. Patient reports contractions have gotten stronger and did do the labor exercises that were encouraged by midwife.  Denies difficulty breathing or respiratory distress, and chest pain.  Objective:  BP 126/79 (BP Location: Right Arm)   Pulse 70   Temp 98.8 F (37.1 C) (Oral)   Resp 16   Ht 5\' 2"  (1.575 m)   Wt 84.8 kg Comment: 187 lbs  LMP 01/21/2020 (Approximate)   BMI 34.20 kg/m  No intake/output data recorded. No intake/output data recorded.  FHT:  FHR: 130 bpm, variability: moderate,  accelerations:  Present,  decelerations:  Absent UC:   irregular, every 5-8 minutes SVE:   Dilation: 5.5 Effacement (%): 90 Station: -2 Exam by:: [email or web address], CNM  Labs:  Lab Results  Component Value Date   WBC 11.1 (H) 12/14/2020   HGB 12.4 12/14/2020   HCT 36.4 12/14/2020   MCV 88.3 12/14/2020   PLT 165 12/14/2020    Assessment:  35 yr. 12/16/2020 [redacted]w[redacted]d admitted for PROM   Plan:  Labor: Progressing, encouraged patient to sit backward on the toilet the next time she needed to go and sit through a couple of contractions on the toilet. Discussed with patient options of Cytotec and nipple stimulation for getting contractions closer together. Patient decided to proceed with Cytotec.   Reviewed red flag symptoms and when to call  Anticipate vaginal delivery  Will reassess as needed.  [redacted]w[redacted]d, Student-MidWife Juliann Pares 12/14/2020, 4:02 PM

## 2020-12-14 NOTE — Anesthesia Procedure Notes (Signed)
Epidural Patient location during procedure: OB Start time: 12/14/2020 10:28 PM End time: 12/14/2020 10:42 PM  Staffing Anesthesiologist: Donato Schultz, MD Performed: anesthesiologist   Epidural Patient position: sitting Prep: ChloraPrep Patient monitoring: heart rate, continuous pulse ox and blood pressure Approach: midline Location: L3-L4 Injection technique: LOR saline  Needle:  Needle type: Tuohy  Needle gauge: 18 G Needle length: 9 cm Needle insertion depth: 4.5 cm Catheter size: 20 Guage Catheter at skin depth: 10 cm Test dose: negative and 1.5% lidocaine with Epi 1:200 K  Additional Notes Crisp loss of resistance to saline on 1st pass. Negative aspiration, negative test dose. Patient tolerated the procedure well. No apparent complications.Reason for block:at surgeon's request and procedure for pain

## 2020-12-15 ENCOUNTER — Encounter: Payer: Self-pay | Admitting: Certified Nurse Midwife

## 2020-12-15 DIAGNOSIS — O4202 Full-term premature rupture of membranes, onset of labor within 24 hours of rupture: Secondary | ICD-10-CM

## 2020-12-15 DIAGNOSIS — Z3A40 40 weeks gestation of pregnancy: Secondary | ICD-10-CM | POA: Diagnosis not present

## 2020-12-15 LAB — CBC WITH DIFFERENTIAL/PLATELET
Abs Immature Granulocytes: 0.51 10*3/uL — ABNORMAL HIGH (ref 0.00–0.07)
Basophils Absolute: 0.1 10*3/uL (ref 0.0–0.1)
Basophils Relative: 0 %
Eosinophils Absolute: 0 10*3/uL (ref 0.0–0.5)
Eosinophils Relative: 0 %
HCT: 32.7 % — ABNORMAL LOW (ref 36.0–46.0)
Hemoglobin: 11.1 g/dL — ABNORMAL LOW (ref 12.0–15.0)
Immature Granulocytes: 2 %
Lymphocytes Relative: 10 %
Lymphs Abs: 2.3 10*3/uL (ref 0.7–4.0)
MCH: 30.3 pg (ref 26.0–34.0)
MCHC: 33.9 g/dL (ref 30.0–36.0)
MCV: 89.3 fL (ref 80.0–100.0)
Monocytes Absolute: 2.1 10*3/uL — ABNORMAL HIGH (ref 0.1–1.0)
Monocytes Relative: 9 %
Neutro Abs: 19.4 10*3/uL — ABNORMAL HIGH (ref 1.7–7.7)
Neutrophils Relative %: 79 %
Platelets: 172 10*3/uL (ref 150–400)
RBC: 3.66 MIL/uL — ABNORMAL LOW (ref 3.87–5.11)
RDW: 14.3 % (ref 11.5–15.5)
WBC: 24.5 10*3/uL — ABNORMAL HIGH (ref 4.0–10.5)
nRBC: 0 % (ref 0.0–0.2)

## 2020-12-15 MED ORDER — BENZOCAINE-MENTHOL 20-0.5 % EX AERO
1.0000 "application " | INHALATION_SPRAY | CUTANEOUS | Status: DC | PRN
  Administered 2020-12-15: 1 via TOPICAL
  Filled 2020-12-15: qty 56

## 2020-12-15 MED ORDER — DIBUCAINE (PERIANAL) 1 % EX OINT
1.0000 "application " | TOPICAL_OINTMENT | CUTANEOUS | Status: DC | PRN
  Administered 2020-12-15: 1 via RECTAL
  Filled 2020-12-15 (×2): qty 28

## 2020-12-15 MED ORDER — ONDANSETRON HCL 4 MG PO TABS
4.0000 mg | ORAL_TABLET | ORAL | Status: DC | PRN
  Filled 2020-12-15: qty 1

## 2020-12-15 MED ORDER — IBUPROFEN 600 MG PO TABS
600.0000 mg | ORAL_TABLET | Freq: Four times a day (QID) | ORAL | Status: DC
  Administered 2020-12-15 – 2020-12-17 (×8): 600 mg via ORAL
  Filled 2020-12-15 (×8): qty 1

## 2020-12-15 MED ORDER — DIPHENHYDRAMINE HCL 25 MG PO CAPS
25.0000 mg | ORAL_CAPSULE | Freq: Four times a day (QID) | ORAL | Status: DC | PRN
Start: 2020-12-15 — End: 2020-12-17

## 2020-12-15 MED ORDER — MISOPROSTOL 200 MCG PO TABS
800.0000 ug | ORAL_TABLET | Freq: Once | ORAL | Status: AC
  Administered 2020-12-15: 800 ug via RECTAL

## 2020-12-15 MED ORDER — SODIUM CHLORIDE 0.9% FLUSH: 3.0000 mL | INTRAVENOUS | Status: DC | PRN

## 2020-12-15 MED ORDER — METHYLERGONOVINE MALEATE 0.2 MG PO TABS
0.2000 mg | ORAL_TABLET | ORAL | Status: DC | PRN
  Filled 2020-12-15: qty 1

## 2020-12-15 MED ORDER — PRENATAL MULTIVITAMIN CH
1.0000 | ORAL_TABLET | Freq: Every day | ORAL | Status: DC
  Administered 2020-12-15 – 2020-12-17 (×3): 1 via ORAL
  Filled 2020-12-15 (×3): qty 1

## 2020-12-15 MED ORDER — HYDROMORPHONE HCL 1 MG/ML IJ SOLN
INTRAMUSCULAR | Status: AC
  Filled 2020-12-15: qty 1

## 2020-12-15 MED ORDER — ONDANSETRON HCL 4 MG/2ML IJ SOLN: 4.0000 mg | INTRAMUSCULAR | Status: DC | PRN

## 2020-12-15 MED ORDER — SODIUM CHLORIDE 0.9 % IV SOLN: 250.0000 mL | INTRAVENOUS | Status: DC | PRN

## 2020-12-15 MED ORDER — SENNOSIDES-DOCUSATE SODIUM 8.6-50 MG PO TABS
2.0000 | ORAL_TABLET | ORAL | Status: DC
  Administered 2020-12-15 – 2020-12-17 (×3): 2 via ORAL
  Filled 2020-12-15 (×3): qty 2

## 2020-12-15 MED ORDER — SODIUM CHLORIDE 0.9% FLUSH
3.0000 mL | Freq: Two times a day (BID) | INTRAVENOUS | Status: DC
  Administered 2020-12-15: 3 mL via INTRAVENOUS

## 2020-12-15 MED ORDER — COCONUT OIL OIL
1.0000 "application " | TOPICAL_OIL | Status: DC | PRN
  Filled 2020-12-15: qty 120

## 2020-12-15 MED ORDER — SIMETHICONE 80 MG PO CHEW: 80.0000 mg | CHEWABLE_TABLET | ORAL | Status: DC | PRN

## 2020-12-15 MED ORDER — IBUPROFEN 600 MG PO TABS
ORAL_TABLET | ORAL | Status: AC
  Filled 2020-12-15: qty 1

## 2020-12-15 MED ORDER — WITCH HAZEL-GLYCERIN EX PADS
1.0000 "application " | MEDICATED_PAD | CUTANEOUS | Status: DC | PRN
  Filled 2020-12-15: qty 100

## 2020-12-15 MED ORDER — ACETAMINOPHEN 325 MG PO TABS
650.0000 mg | ORAL_TABLET | ORAL | Status: DC | PRN
  Administered 2020-12-15 (×2): 650 mg via ORAL
  Filled 2020-12-15 (×2): qty 2

## 2020-12-15 MED ORDER — METHYLERGONOVINE MALEATE 0.2 MG/ML IJ SOLN: 0.2000 mg | INTRAMUSCULAR | Status: DC | PRN

## 2020-12-15 NOTE — Progress Notes (Signed)
Pitocin is on 7ml at 0100, 58ml at 0130

## 2020-12-15 NOTE — Lactation Note (Signed)
This note was copied from a baby's chart. Lactation Consultation Note  Patient Name: Melinda Wells [id number] Date: 12/15/2020   Age:34 hours  Maternal Data    Feeding   Assisted mom with breastfeeding with [name] in cradle hold on left breast.  Demonstrated hand expression.  He latched with minimal assistance and began strong, rhythmic sucking with occasional swallow.  Mom kept pulling back on the breast to allow for more breathing room and pulling the breast out of his mouth or he was slipping back to the tip of the nipple and getting a shallow latch.  Demonstrated to mom how to push down towards his mouth to allow more room for the nose without pulling the breast back for him to sustain the latch.  Demonstrated how to massage breast and gently stimulate [name] to keep him actively sucking at the breast.  Reviewed feeding cues and encouraged parents to put him to the breast whenever he demonstrated hunger cues.  Mom reports wanting to do breast and give bottles of pumped milk and formula later.  Encouraged mom to just put [name] to the breast and not give any bottles until mature milk has transitioned in and breast feeding is well established.  Mom works for [employer] and has [employer]  Sonata Medela DEBP given with instructions in use.  Hand out given on what to expect with breast feeding the first 4 days of life reviewing newborn stomach size, normal course of lactation, burping,  adequate intake and out put, supply and demand and routine newborn feeding patterns. Lactation Resource sheet has been given and reviewed support groups, informational web sites and contact numbers.  Lactation name and number has been written on white board and encouraged to call with any questions, concerns or assistance. LATCH Score                    Lactation Tools Discussed/Used    Interventions    Discharge    Consult Status      Louis Meckel 12/15/2020, 5:32 PM

## 2020-12-16 LAB — CBC
HCT: 30 % — ABNORMAL LOW (ref 36.0–46.0)
Hemoglobin: 10.2 g/dL — ABNORMAL LOW (ref 12.0–15.0)
MCH: 29.9 pg (ref 26.0–34.0)
MCHC: 34 g/dL (ref 30.0–36.0)
MCV: 88 fL (ref 80.0–100.0)
Platelets: 144 10*3/uL — ABNORMAL LOW (ref 150–400)
RBC: 3.41 MIL/uL — ABNORMAL LOW (ref 3.87–5.11)
RDW: 14.2 % (ref 11.5–15.5)
WBC: 21.8 10*3/uL — ABNORMAL HIGH (ref 4.0–10.5)
nRBC: 0.1 % (ref 0.0–0.2)

## 2020-12-16 NOTE — Anesthesia Postprocedure Evaluation (Signed)
Anesthesia Post Note  Patient: Melinda Wells  Procedure(s) Performed: AN AD HOC LABOR EPIDURAL  Patient location during evaluation: Mother Baby Anesthesia Type: Epidural Level of consciousness: awake and alert Pain management: pain level controlled Vital Signs Assessment: post-procedure vital signs reviewed and stable Respiratory status: spontaneous breathing, nonlabored ventilation and respiratory function stable Cardiovascular status: stable Postop Assessment: no headache, no backache and epidural receding Anesthetic complications: no   No complications documented.   Last Vitals:  Vitals:   12/15/20 2316 12/16/20 0803  BP: 119/81 115/90  Pulse: 96 75  Resp: 19   Temp: 36.9 C 36.8 C  SpO2: 97% 98%    Last Pain:  Vitals:   12/16/20 1100  TempSrc:   PainSc: 0-No pain                 Corinda Gubler

## 2020-12-16 NOTE — Progress Notes (Signed)
Patient ID: Melinda Wells, female   DOB: 1986/06/05, 35 y.o.   MRN: [id number]  Post Partum Day # 1, s/p spontaneous vaginal birth, Rh positive, GBS negative, History of full term premature rupture of membranes  Subjective:  Patient sitting at bedside with FOB. [facility] to [facility] collection.   Denies difficulty breathing or respiratory distress, chest pain, abdominal pain, excessive vaginal bleeding, dysuria, and leg pain or swelling.   Objective: Temp:  [97.8 F (36.6 C)-98.7 F (37.1 C)] 98.3 F (36.8 C) (05/30 0803) Pulse Rate:  [75-96] 75 (05/30 0803) Resp:  [16-20] 19 (05/29 2316) BP: (115-124)/(72-90) 115/90 (05/30 0803) SpO2:  [96 %-99 %] 98 % (05/30 0803)  Physical Exam:   General: alert and cooperative   Lungs: clear to auscultation bilaterally  Breasts: deferred, no complaints  Heart: normal apical impulse  Abdomen: soft, non-tender; bowel sounds normal; no masses,  no organomegaly  Pelvis: Lochia: appropriate,  Uterine Fundus: firm  Extremities: DVT Evaluation: No evidence of DVT seen on physical exam.  Recent Labs    12/15/20 0703 12/16/20 0315  HGB 11.1* 10.2*  HCT 32.7* 30.0*    Assessment:  35 year old G3P1, Post Partum Day # 1, s/p spontaneous vaginal birth, Rh positive, GBS negative, History of full term premature rupture of membranes  Breastfeeding  Plan:  Routine postpartum care and education.   Lactation consultant, see orders.   Reviewed red flag symptoms and when to call.   Plan of care updated, anticipate discharge tomorrow.    LOS: 2 days   Gunnar Bulla, CNM [facility], [facility] 12/16/2020 9:08 AM

## 2020-12-16 NOTE — Lactation Note (Signed)
This note was copied from a baby's chart. Lactation Consultation Note  Patient Name: Melinda Wells [id number] Date: 12/16/2020 Reason for consult: Follow-up assessment;Mother's request;Primapara;Term;Other (Comment) ([name] breast fed for about 1 1/2 hos) Age:34 hours  Maternal Data Has patient been taught Hand Expression?: Yes Does the patient have breastfeeding experience prior to this delivery?: No (First baby)  Feeding Mother's Current Feeding Choice: Breast Milk and Formula Nipple Type: Slow - flow  Assisted mom in comfortable position with pillow support on right breast in cradle hold.  It takes a few attempts to get a deep enough latch that mom is not hurting.  [name] has a good rhythmic suck with an occasional swallow.  Mom continues to massage her breast during the feeding to keep him actively sucking at the breast.  LC left the room after about 30 minutes of sucking.  When I returned 1 hour later, mom reports that he had been at the breast for 1 1/2 hours and still acts hungry when he comes off.  He seems a little jittery.  RN took blood glucose and got 64.  Mom did have a 1,381 blood loss after delivery.  Mom is concerned that he is not getting enough, he looks a little jaundice and is jittery and is asking to supplement.  Discussed DBM or formula and bottle, SNS, finger feed, syringe options.  Mom chose bottle of Similac.  He took 8 ml Similac 20 calorie via bottle and got the hiccups.  Symphony was set up in the room with instructions in warmth, hand expression, massage, when and how to pump, collection, storage, cleaning, labeling, handling and giving expressed milk.  Mom pumped more comfortably with 27 flanges and got a few drops in the flange of the pump which she rubbed on her nipples to prevent bacteria, lubricate and for comfort.  Mom does report her nipples getting sore and saw a little blood last night after a feeding.  Hand expressed a couple of drops and did not see any bleeding  or trauma to the nipples.  Mom has her own nipple lanolin which was used to put around the flange on the pump to get a better seal and make more comfortable.  Comfort gels given after pumping and instructed in use.  Mom really felt like the comfort gels were helping.  Encouraged mom to breast feed and or pump every time she supplemented with formula.  Lactation name and number are written on white board and encouraged to call with any questions, concerns or assistance. LATCH Score Latch: Repeated attempts needed to sustain latch, nipple held in mouth throughout feeding, stimulation needed to elicit sucking reflex.  Audible Swallowing: A few with stimulation  Type of Nipple: Everted at rest and after stimulation  Comfort (Breast/Nipple): Filling, red/small blisters or bruises, mild/mod discomfort  Hold (Positioning): Assistance needed to correctly position infant at breast and maintain latch.  LATCH Score: 6   Lactation Tools Discussed/Used Tools: Pump;Comfort gels;Bottle Breast pump type: Double-Electric Breast Pump Pump Education: Setup, frequency, and cleaning;Milk Storage;Other (comment) Reason for Pumping: Mom supplementing Pumping frequency: Encouraged to either breast feed and/or pump every time she supplements Pumped volume:  (Pumped 2 to 3 drops of colostrum)  Interventions Interventions: Breast feeding basics reviewed;Assisted with latch;Skin to skin;Breast massage;Hand express;Pre-pump if needed;Reverse pressure;Breast compression;Adjust position;Support pillows;Position options;Expressed milk;Comfort gels;DEBP;Education  Discharge Discharge Education: Engorgement and breast care;Warning signs for feeding baby;Other (comment) Pump: DEBP;Personal (Symphony set up in room to use while here and Sonata given from  [facility] Insurance) WIC Program: No [facility] Insurance)  Consult Status Consult Status: Follow-up Date: 12/16/20 Follow-up type: Call as needed    Louis Meckel 12/16/2020, 4:44 PM

## 2020-12-17 ENCOUNTER — Other Ambulatory Visit: Payer: Self-pay

## 2020-12-17 MED ORDER — IBUPROFEN 600 MG PO TABS
600.0000 mg | ORAL_TABLET | Freq: Four times a day (QID) | ORAL | 0 refills | Status: DC
  Filled 2020-12-17: qty 30, 8d supply, fill #0

## 2020-12-17 MED ORDER — ACETAMINOPHEN 500 MG PO TABS
1000.0000 mg | ORAL_TABLET | Freq: Four times a day (QID) | ORAL | 0 refills | Status: DC | PRN
  Filled 2020-12-17: qty 60, 8d supply, fill #0

## 2020-12-17 MED ORDER — BENZOCAINE-MENTHOL 20-0.5 % EX AERO
1.0000 "application " | INHALATION_SPRAY | CUTANEOUS | 1 refills | Status: DC | PRN
  Filled 2020-12-17: qty 56, fill #0

## 2020-12-17 MED ORDER — WITCH HAZEL-GLYCERIN EX PADS
1.0000 "application " | MEDICATED_PAD | CUTANEOUS | 12 refills | Status: DC | PRN
  Filled 2020-12-17: qty 40, fill #0

## 2020-12-17 NOTE — Progress Notes (Signed)
Patient discharged, remains with infant rooming in. Discharge instructions, when to follow up, and prescriptions reviewed with patient.  Patient verbalized understanding.

## 2020-12-17 NOTE — Lactation Note (Signed)
This note was copied from a baby's chart. Lactation Consultation Note  Patient Name: Melinda Wells [id number] Date: 12/17/2020 Reason for consult: Follow-up assessment;Primapara;Term;Hyperbilirubinemia Age:34 hours  Lactation follow-up. Baby placed under phototherapy lights this morning due to elevated bilirubin levels. RN and CNM report a prolonged labor and delay in lactogenesis II. Currently mom is putting baby to breast, supplementing with formula and pumping.  LC at bedside to reiterate feeding plan. Encouraged baby to be at the breast for only 20 minutes, provide supplement of 20-64mL post breastfeeding, and utilize DEBP in room for additional stimulation. Feeding/pumping routine and schedule written on whiteboard, and encouraged feeding every 3 hours throughout to day to increase output and lower bili levels. Next anticipated feeding at 11:30am.  Mom verbalizes understanding of feeding schedule, and all steps involved. Whiteboard updated to reflect LC for today, encouraged to call with next feeding.  Maternal Data Has patient been taught Hand Expression?: Yes Does the patient have breastfeeding experience prior to this delivery?: No  Feeding Mother's Current Feeding Choice: Breast Milk Nipple Type: Slow - flow  LATCH Score                    Lactation Tools Discussed/Used Tools: Pump Breast pump type: Double-Electric Breast Pump Reason for Pumping: stimulation; hyperbili Pumping frequency: q 3 hours  Interventions Interventions: Breast feeding basics reviewed;Education;DEBP  Discharge Pump: Personal;DEBP  Consult Status Consult Status: Follow-up Date: 12/17/20 Follow-up type: In-patient    Danford Bad 12/17/2020, 9:32 AM

## 2020-12-17 NOTE — Discharge Summary (Signed)
Obstetric Discharge Summary  Patient ID: Melinda Wells MRN: [id number] DOB/AGE: [birth year] 35 y.o.   Date of Admission: 12/14/2020  Date of Discharge:  12/17/20   Admitting Diagnosis: Premature rupture of membrane at [redacted]w[redacted]d  Secondary Diagnosis:   Patient Active Problem List   Diagnosis Date Noted  . [redacted] weeks gestation of pregnancy   . Third-stage postpartum hemorrhage   . Full-term premature rupture of membranes 12/14/2020  . Overweight (BMI 25.0-29.9) 08/20/2016   Mode of Delivery: normal spontaneous vaginal delivery     Discharge Diagnosis: Post partum hemorrhage   Intrapartum Procedures: epidural, pitocin augmentation and cytotec augmentation   Post partum procedures: None  Complications: Second degree perineal laceration and bilateral labial laceration, repaired   Brief Hospital Course   Melinda Wells is a [id number] who had a spontaneous vaginal birth on 12/15/2020;  for further details, please refer to the delivey summary.  Patient had an uncomplicated postpartum course after management of third stage hemorrhage.  By time of discharge on PPD#2, her pain was controlled on oral pain medications; she had appropriate lochia and was ambulating, voiding without difficulty and tolerating regular diet.  She was deemed stable for discharge to home.    Labs: CBC Latest Ref Rng & Units 12/16/2020 12/15/2020 12/14/2020  WBC 4.0 - 10.5 K/uL 21.8(H) 24.5(H) 11.1(H)  Hemoglobin 12.0 - 15.0 g/dL 10.2(L) 11.1(L) 12.4  Hematocrit 36.0 - 46.0 % 30.0(L) 32.7(L) 36.4  Platelets 150 - 400 K/uL 144(L) 172 165   O POS Performed at [facility], [address] [place], [place] [address]   Physical exam:   Temp:  [98.3 F (36.8 C)-98.7 F (37.1 C)] 98.3 F (36.8 C) (05/31 0851) Pulse Rate:  [78-88] 88 (05/31 0851) Resp:  [16] 16 (05/31 0035) BP: (120-133)/(80-97) 120/80 (05/31 0851) SpO2:  [98 %-99 %] 98 % (05/31 0851)  General: alert and no distress  Lochia:  appropriate  Abdomen: soft, NT  Uterine Fundus: firm  Perineum: healing well, no significant drainage, no dehiscence, no significant erythema  Extremities: No evidence of DVT seen on physical exam. No lower extremity edema.  Edinburgh Postnatal Depression Scale Screening Tool 12/15/2020 12/15/2020  I have been able to laugh and see the funny side of things. 0 (No Data)  I have looked forward with enjoyment to things. 0 -  I have blamed myself unnecessarily when things went wrong. 2 -  I have been anxious or worried for no good reason. 1 -  I have felt scared or panicky for no good reason. 0 -  Things have been getting on top of me. 1 -  I have been so unhappy that I have had difficulty sleeping. 0 -  I have felt sad or miserable. 0 -  I have been so unhappy that I have been crying. 0 -  The thought of harming myself has occurred to me. 0 -  Edinburgh Postnatal Depression Scale Total 4 -     Discharge Instructions: Per After Visit Summary.  Activity: Advance as tolerated. Pelvic rest for 6 weeks.  Also refer to After Visit Summary  Diet: Regular  Medications: Allergies as of 12/17/2020   No Known Allergies     Medication List    STOP taking these medications   OVER THE COUNTER MEDICATION     TAKE these medications   acetaminophen 500 MG tablet Commonly known as: TYLENOL Take 2 tablets (1,000 mg total) by mouth every 6 (six) hours as needed for mild pain or  moderate pain.   benzocaine-Menthol 20-0.5 % Aero Commonly known as: DERMOPLAST Apply 1 application topically as needed for irritation (perineal discomfort).   CALCIUM-MAGNESIUM-ZINC PO   docusate sodium 100 MG capsule Commonly known as: COLACE   folic acid 400 MCG tablet Commonly known as: FOLVITE   ibuprofen 600 MG tablet Commonly known as: ADVIL Take 1 tablet (600 mg total) by mouth every 6 (six) hours.   multivitamin-prenatal 27-0.8 MG Tabs tablet Take 1 tablet by mouth daily at 12 noon.   Vitamin D  (Ergocalciferol) 1.25 MG (50000 UNIT) Caps capsule Commonly known as: DRISDOL TAKE 1 CAPSULE BY MOUTH ONCE A WEEK   witch hazel-glycerin pad Commonly known as: TUCKS Apply 1 application topically as needed for hemorrhoids.      Outpatient follow up:   Follow-up Information    Gunnar Bulla, CNM Follow up.   Specialties: Certified Nurse Midwife, Obstetrics and Gynecology, Radiology Why: Someone from the office will contact you to schedule a two (2) week TELEVISIT and six (6) week POSTPARTUM VISIT with JML.  Contact information: [address] [place] [address] [phone]              Postpartum contraception: Undecided; will discuss further at postpartum visit  Discharged Condition: stable  Discharged to: home   Newborn Data:  Disposition:rooming in  Apgars: APGAR (1 MIN): 8   APGAR (5 MINS): 9    Baby Feeding: Bottle and Breast    [name], CNM  [facility], [facility] 12/17/20 9:29 AM

## 2020-12-17 NOTE — Discharge Instructions (Signed)
Discharge instructions:   Call office if you have any of the following:  headache, visual changes, fever >100.4 F, chills, breast concerns (engorgement, mastitis) excessive vaginal bleeding, incision drainage or problems, leg pain or redness, depression or any other concerns.   Activity: Do not lift > 10 lbs for 6 weeks.  No intercourse or tampons for 6 weeks.  No driving for 1-2 weeks or while taking pain medication. No strenuous activity or heavy lifting for 6 weeks.  No swimming pools, hot tubs or tub baths- showers only.    It is normal to bleed for up to 6 weeks. You should not soak through more than 1 pad in 1 hour.   Continue prenatal vitamin. Increase calories and fluids while breastfeeding.  Your milk will come in, in the next couple of days (right now it is colostrum).  You may have a slight fever when your milk comes in, but it should go away on its own.   If it does not, and rises above 101 F please call the doctor.  You will also feel achy and your breasts will be firm. They will also start to leak.  If you are breastfeeding, continue as you have been and you can pump/express milk for comfort.   For concerns about your baby, please call your pediatrician For breastfeeding concerns, the lactation consultant can be reached at [phone]  Postpartum blues (feelings of happy one minute and sad another minute) are normal for the first few weeks but if it gets worse let your doctor know.     Obstetrics and Gynecology, 128(1), e1-e15. [email or web address]">  Care of a Perineal Tear The following information offers guidance about how to care for a perineal tear (laceration). Some women develop a perineal tear during a vaginal birth. This can happen as the baby emerges from the birth canal and the area between the vagina and the anus (perineum) is stretched, or a surgical cut is made (episiotomy). There are four degrees of perineal tears based on the depth  and length of the laceration.  First degree. This involves a shallow tear at the edge of the vaginal opening that extends slightly into the perineal skin.  Second degree. This involves tearing described in first-degree perineal tear, and an additional deeper tear of the vaginal opening and perineal tissues. It may also include tearing of a muscle just under the perineal skin.  Third degree. This involves tearing described in first-degree and second-degree perineal tears. Tearing in the third degree extends into the muscle of the anus (anal sphincter).  Fourth degree. This involves tears described in all three degrees. Tearing in the fourth degree extends into the rectum. First-degree and second-degree perineal tears may or may not be stitched closed, depending on their location and appearance. Third-degree and fourth-degree perineal tears are stitched closed immediately after the baby's birth. What are the risks? Depending on the type of perineal tear you have, you may be at risk for:  Bleeding.  Developing a collection of blood in the perineal tear area (hematoma).  Pain when you urinate or have a bowel movement.  Infection at the site of the tear.  Fever.  Trouble controlling urination or bowel movements (incontinence).  Painful sex. How to care for a perineal tear Wound care  Take sitz baths as told by your health care provider to speed up healing. In a sitz bath, you sit down in warm water. A sitz bath can be taken in one of two ways: ? Using an  over-the-toilet sitz bath. ? Using a bathtub. The water should be deep enough to cover your hips and buttocks.  Wear a sanitary pad as told by your health care provider. Change the pad as often as told.  Leave stitches (sutures), skin glue, or adhesive tape in place. Do not remove adhesive tape unless your health care provider tells you to do so.  Check your wound every day for signs of infection. Check for: ? Redness, swelling, or  pain. ? Fluid or blood. ? Warmth. ? Pus or a bad smell.      Managing pain  If directed, put ice on the affected area. To do this: ? Put ice in a plastic bag. ? Place a towel between your skin and the bag. ? Leave the ice on for 20 minutes, 2-3 times a day. ? Remove the ice if your skin turns bright red. This is very important. If you cannot feel pain, heat, or cold, you have a greater risk of damage to the area.  Apply a numbing spray to the perineal tear site as told by your health care provider. This may help with discomfort.  Take or apply over-the-counter and prescription medicines only as told by your health care provider.  If told, put about three witch hazel-containing hemorrhoid treatment pads on top of your sanitary pad. The witch hazel in the hemorrhoid pads helps with swelling and discomfort.  If comfortable, sit on an inflatable ring or pillow.   General instructions  Use a squirt bottle to squeeze warm water on your perineum after urinating to clean the area. This is instead of wiping and should be done from front to back. Pat the area gently to dry it.  Do not have sex, use tampons, or place anything in your vagina for at least 6 weeks or as told by your health care provider.  Keep all follow-up visits, including postpartum visits. This is important. Contact a health care provider if:  Your pain is not relieved with medicines.  You have painful urination.  You have redness, swelling, or pain around your tear.  You have fluid or blood coming from your tear.  Your tear feels warm to the touch.  You have pus or a bad smell coming from your tear.  You have a fever. Get help right away if:  Your tear opens.  You cannot urinate.  You have an increase in bleeding.  You have severe pain. Summary  A perineal tear is a tear (laceration) in the tissue between the opening of the vagina and the anus (perineum).  There are four degrees of perineal tears based  on how deep and long the laceration is.  First-degree and second-degree perineal tears may or may not be stitched closed, depending on their location and appearance. Third-degree and fourth-degree perineal tears are stitched closed immediately after the baby's birth.  Get help right away if your tear opens, you cannot urinate, you have an increase in bleeding, or you have severe pain. This information is not intended to replace advice given to you by your health care provider. Make sure you discuss any questions you have with your health care provider. Document Revised: 03/21/2020 Document Reviewed: 03/21/2020 Elsevier Patient Education  2021 [employer].

## 2020-12-18 ENCOUNTER — Other Ambulatory Visit: Payer: Self-pay

## 2020-12-18 ENCOUNTER — Encounter: Payer: No Typology Code available for payment source | Admitting: Certified Nurse Midwife

## 2020-12-18 ENCOUNTER — Other Ambulatory Visit: Payer: No Typology Code available for payment source

## 2020-12-26 ENCOUNTER — Ambulatory Visit: Payer: Self-pay

## 2020-12-26 NOTE — Lactation Note (Signed)
This note was copied from a baby's chart. Lactation Consultation Note  Patient Name: Melinda Wells [id number] Date: 12/26/2020   Age:35 years  Lactation consult referral requested by Pediatrician Dr. Alberteen Spindle at [facility] for pre/post weight feeding and feeding assessment. This is first baby for mom who currently is supplementing 50-61mL post breastfeeding. This recommendation was given while in hospital due to elevated bilirubin, and has not been discontinued.   Current feeding plan: Breastfeed for 15 minutes per side + supplement of 50-58mL of formula or EBM if mom has any. Mom is pumping 2-3 x a day.  Baby just switched to similac sensitive due to severe diaper rash, increase gas/fussiness with Similac 360.  Baby returned to birth weight by day 8 of life, and has surpassed it by 2% at most recent doctors appointment.  Pre-feed weight: 3814g 1st breast for 18 minutes (mom had to stop feeding) weight: 3862~29mL transferred. 2nd breast for 15 minutes (mom had to stop feeding) weight: 3890g~44mL transferred  Total transfer volume in 34 minutes- 27mL~2.533oz.  Discussed with parents newborn stomach size, growth patterns, and tips for decreasing the amount of supplement to be given- parents desire exclusive breastfeeding. -No length restriction on feedings: allow baby to feed on one breast as long as active, alert, and nutritive sucking is continued.  ----education given to parents on fore and hind milk, and importance of reaching both for nutrition of baby. -Offer second breast based on hunger cues from infant- reviewed hunger cues, soothing techniques, burping strategies, and how to determine if more is needed. -Option given for mom to pump opposite breast if satisfaction is reached on one side, or if baby does not fully empty second side. -Encouraged supplement to be used only as needed based on cues from infant after offering both sides. Educated again on infant stomach size and slowly  reducing amount of supplement given until able to learn his fullness/hunger levels independently.   Education given on growth spurts and cluster feeding, importance of putting to breast on demand to aid in building of supply- milk supply and demand-  Education given on signs of overfeeding with supplement, alternative soothing techniques if baby is not hungry (gas), and paced bottle feeding if baby is given supplement/bottle.  Parents verbalize confidence in feeding plan. Phone follow-up scheduled for 1 week from today (01/02/21).  Pediatrician follow-up appointment scheduled for 01/01/21.   Danford Bad 12/26/2020, 3:03 PM

## 2020-12-30 ENCOUNTER — Telehealth: Payer: Self-pay | Admitting: Certified Nurse Midwife

## 2020-12-30 NOTE — Telephone Encounter (Signed)
Left message for patient to call back and schedule her 6 week PPV

## 2021-01-27 ENCOUNTER — Other Ambulatory Visit: Payer: Self-pay

## 2021-01-27 ENCOUNTER — Encounter: Payer: Self-pay | Admitting: Certified Nurse Midwife

## 2021-01-27 ENCOUNTER — Ambulatory Visit (INDEPENDENT_AMBULATORY_CARE_PROVIDER_SITE_OTHER): Payer: No Typology Code available for payment source | Admitting: Certified Nurse Midwife

## 2021-01-27 DIAGNOSIS — O9229 Other disorders of breast associated with pregnancy and the puerperium: Secondary | ICD-10-CM

## 2021-01-27 NOTE — Patient Instructions (Signed)
Preventive Care 26-34 Years Old, Female Preventive care refers to lifestyle choices and visits with your health care provider that can promote health and wellness. This includes: A yearly physical exam. This is also called an annual wellness visit. Regular dental and eye exams. Immunizations. Screening for certain conditions. Healthy lifestyle choices, such as: Eating a healthy diet. Getting regular exercise. Not using drugs or products that contain nicotine and tobacco. Limiting alcohol use. What can I expect for my preventive care visit? Physical exam Your health care provider may check your: Height and weight. These may be used to calculate your BMI (body mass index). BMI is a measurement that tells if you are at a healthy weight. Heart rate and blood pressure. Body temperature. Skin for abnormal spots. Counseling Your health care provider may ask you questions about your: Past medical problems. Family's medical history. Alcohol, tobacco, and drug use. Emotional well-being. Home life and relationship well-being. Sexual activity. Diet, exercise, and sleep habits. Work and work Statistician. Access to firearms. Method of birth control. Menstrual cycle. Pregnancy history. What immunizations do I need?  Vaccines are usually given at various ages, according to a schedule. Your health care provider will recommend vaccines for you based on your age, medicalhistory, and lifestyle or other factors, such as travel or where you work. What tests do I need?  Blood tests Lipid and cholesterol levels. These may be checked every 5 years starting at age 65. Hepatitis C test. Hepatitis B test. Screening Diabetes screening. This is done by checking your blood sugar (glucose) after you have not eaten for a while (fasting). STD (sexually transmitted disease) testing, if you are at risk. BRCA-related cancer screening. This may be done if you have a family history of breast, ovarian, tubal, or  peritoneal cancers. Pelvic exam and Pap test. This may be done every 3 years starting at age 67. Starting at age 37, this may be done every 5 years if you have a Pap test in combination with an HPV test. Talk with your health care provider about your test results, treatment options,and if necessary, the need for more tests. Follow these instructions at home: Eating and drinking  Eat a healthy diet that includes fresh fruits and vegetables, whole grains, lean protein, and low-fat dairy products. Take vitamin and mineral supplements as recommended by your health care provider. Do not drink alcohol if: Your health care provider tells you not to drink. You are pregnant, may be pregnant, or are planning to become pregnant. If you drink alcohol: Limit how much you have to 0-1 drink a day. Be aware of how much alcohol is in your drink. In the [place] one drink equals one 12 oz bottle of beer (355 mL), one 5 oz glass of wine (148 mL), or one 1 oz glass of hard liquor (44 mL).  Lifestyle Take daily care of your teeth and gums. Brush your teeth every morning and night with fluoride toothpaste. Floss one time each day. Stay active. Exercise for at least 30 minutes 5 or more days each week. Do not use any products that contain nicotine or tobacco, such as cigarettes, e-cigarettes, and chewing tobacco. If you need help quitting, ask your health care provider. Do not use drugs. If you are sexually active, practice safe sex. Use a condom or other form of protection to prevent STIs (sexually transmitted infections). If you do not wish to become pregnant, use a form of birth control. If you plan to become pregnant, see your health care  provider for a prepregnancy visit. Find healthy ways to cope with stress, such as: Meditation, yoga, or listening to music. Journaling. Talking to a trusted person. Spending time with friends and family. Safety Always wear your seat belt while driving or riding in a  vehicle. Do not drive: If you have been drinking alcohol. Do not ride with someone who has been drinking. When you are tired or distracted. While texting. Wear a helmet and other protective equipment during sports activities. If you have firearms in your house, make sure you follow all gun safety procedures. Seek help if you have been physically or sexually abused. What's next? Go to your health care provider once a year for an annual wellness visit. Ask your health care provider how often you should have your eyes and teeth checked. Stay up to date on all vaccines. This information is not intended to replace advice given to you by your health care provider. Make sure you discuss any questions you have with your healthcare provider. Document Revised: 03/03/2020 Document Reviewed: 03/17/2018 Elsevier Patient Education  2022 [employer].

## 2021-01-27 NOTE — Progress Notes (Addendum)
Subjective:    Melinda Wells is a 35 y.o. [id number] Hispanic female who presents for a postpartum visit. She is 6 weeks postpartum following a spontaneous vaginal delivery at 40+2 gestational weeks. Anesthesia: epidural. I have fully reviewed the prenatal and intrapartum course.   Postpartum course has been uncomplicated except for nipple pain with breastfeeding, questions infant latch.   Baby's course has been uncomplicated. Baby is feeding by both breast and bottle - Similac Sensitive RS.   Bleeding no bleeding. Bowel function is normal. Bladder function is normal.   Patient is not sexually active.. Contraception method is  LAM .   Postpartum depression screening: negative. Score 5.    Last pap 09/2017 and was Neg/Neg.  Denies difficulty breathing or respiratory distress, chest pain, abdominal pain, excessive vaginal bleeding, dysuria, and leg pain or swelling.   The following portions of the patient's history were reviewed and updated as appropriate: allergies, current medications, past medical history, past surgical history and problem list.  Review of Systems  Pertinent items are noted in HPI.   Objective:   BP 108/76   Pulse 76   Resp 16   Ht 5\' 2"  (1.575 m)   Wt 152 lb 14.4 oz (69.4 kg)   Breastfeeding Yes   BMI 27.97 kg/m   General:  alert, cooperative and no distress   Breasts:  lactating  Lungs: clear to auscultation bilaterally  Heart:  regular rate and rhythm  Abdomen: soft, nontender   Vulva: normal  Vagina: normal vagina  Cervix:  closed  Corpus: Well-involuted  Adnexa:  Non-palpable        Depression screen [facility] 2/9 01/27/2021 11/11/2020  Decreased Interest 0 0  Down, Depressed, Hopeless 1 0  PHQ - 2 Score 1 0  Altered sleeping 0 -  Tired, decreased energy 3 -  Change in appetite 0 -  Feeling bad or failure about yourself  1 -  Trouble concentrating 0 -  Moving slowly or fidgety/restless 0 -  Suicidal thoughts 0 -  PHQ-9 Score 5 -  Difficult doing  work/chores Somewhat difficult -    GAD 7 : Generalized Anxiety Score 01/27/2021  Nervous, Anxious, on Edge 0  Control/stop worrying 0  Worry too much - different things 1  Trouble relaxing 0  Restless 0  Easily annoyed or irritable 1  Afraid - awful might happen 0  Total GAD 7 Score 2  Anxiety Difficulty Not difficult at all     Assessment:   Postpartum exam  Six (6) weeks s/p vaginal birth  Breastfeeding  Depression screening  Contraception counseling    Plan:   Encouraged routine health maintenance techniques.   Discussed progesterone only options; patient will contact CNM if method needed.   Referral to Lactation, see orders.   Reviewed red flag symptoms and when to call.   Follow up in: 8 months for ANNUAL EXAM or earlier if needed.   03/30/2021, CNM [facility], [facility] 01/27/21 2:13 PM
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
## 2021-02-28 ENCOUNTER — Other Ambulatory Visit: Payer: Self-pay | Admitting: Certified Nurse Midwife

## 2021-02-28 ENCOUNTER — Other Ambulatory Visit: Payer: Self-pay

## 2021-02-28 MED ORDER — NORETHINDRONE 0.35 MG PO TABS
1.0000 | ORAL_TABLET | Freq: Every day | ORAL | 11 refills | Status: DC
  Filled 2021-02-28: qty 84, 84d supply, fill #0
  Filled 2021-05-21: qty 84, 84d supply, fill #1
  Filled 2021-08-22: qty 84, 84d supply, fill #2

## 2021-03-03 ENCOUNTER — Ambulatory Visit: Payer: Self-pay

## 2021-03-03 NOTE — Lactation Note (Signed)
This note was copied from a baby's chart. Lactation Consultation Note  Patient Name: Melinda Wells JMEQA'S Date: 03/03/2021 Reason for consult: 1st time breastfeeding;Other (Comment) (poor weight gain) Age:34 m.o.  Lactation Consultation  Baby is 45 weeks old. He has had poor weight gain in the last two weeks. Mother is on a dairy free and soy free diet. Mother states Melinda Wells had been having lots of stools a day with green, foamy poops. Since using generic Nutramigen formula and her diet changing he is now having less stools a day and the color is more yellow to brown. Mother states he breastfeeds in the morning when he wakes up between 6:30-8am and then goes back to sleep til 10-11am. He then feeds at 11, 2pm, and 5pm and 8pm with a bottle of 4-5 ounces of formula, he often falls asleep at the breast, and does not always take the supplement. Mother BF him for 20 minutes on each side but states she has more milk on the left than the right. He has only started taking the formula bottles 4 times a day after breast since Friday. Otherwise she previously has only been supplementing him 1 times a day with her pumped milk. Mother was also pumping 2 times a day and expressing about 4-5 ounces in two pump session. LC noted veining in the breast, breast changes during pregnancy and Mother can easily express milk, nipples are normal and erect with stim.   Naked weight prior to feed was 4998grams.  Pre feed weight with diaper was 5020grams. 22 minutes on left breast 38 ml's transferred 13 minutes on right breast 18 ml's transferred Total ml's transferred was 71ml's for feed after BF on both breasts.  Mother placed baby to left breast, he latched easily, immediately began to suck with a loud clicking/popping sound at breast. LC asked if he always makes this sound when feeding and Mother stated yes. LC described to Mother why baby is making the popping sound when feeding. Popping sound is his tongue loosing  suction at the breast and popping off. This can be indicative of a posterior tongue tie. Also noted lots of jaw quivering during feed which can also indicate fatigue at breast. Mother states he is often very sleepy with or during feeds. Also noted Mother has to hold the breast in the baby's mouth. If she releases the breast he is unable to maintain latch and the entire breast falls out of his mouth. LC felt baby suck on finger. Minimal tongue elevation, high palate noted and lingual frenulum felt under his tongue with finger sweep and upper labial frenulum noted with lips extension. Baby does lateralize his tongue when LC placed finger in his cheek. Mother states he also makes the clicking sounds when bottle feeding. Noted baby has a head turn preference to his left. Encouraged to mention to Peds.  Mother is feeding him with wide neck Dr. Theora Gianotti bottles on a #1 flow. He does not spill milk on the bottle or breast but tires quickly and begins to play/gum the bottle nipple. LC encouraged attempting feeds with the narrow neck Dr. Theora Gianotti bottle #[id number]. Discussed evaluation by Speech Therapy for feeding eval, and possible eval with ENT or Pediatric Dentist that specializes in tongue tie eval. Dr. Rogelia Rohrer in [facility], or Dr. Lexine Baton in [place]. Discussed there is extensive oral stretching for 1-3 weeks after a tongue tie release and Mother would need to consider this prior to a procedure. Discussed Speech/Lactation at [employer],  or if Pediatrician has Speech they would recommend for strengthening exercises. Discussed use of tummy time to help strengthen core.  Mother was encouraged to start pumping with every breastfeed, limiting his time at breast to 10-15 min each side, paying attention to a non-nutritive sucking pattern and when to move to 3-5 ounce supplement and pumping.  Encouraged 6-8 feeds a day or more with cues. Encouraged follow up after pumping 1-2 weeks and milk supply has increased. 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 Discussed waking him to feed until gaining weight well.  Mother stated understanding with all teaching and has no further questions at this time.    Feeding Mother's Current Feeding Choice: Breast Milk and Formula  LATCH Score Latch: Repeated attempts needed to sustain latch, nipple held in mouth throughout feeding, stimulation needed to elicit sucking reflex.  Audible Swallowing: A few with stimulation  Type of Nipple: Everted at rest and after stimulation  Comfort (Breast/Nipple): Soft / non-tender  Hold (Positioning): No assistance needed to correctly position infant at breast.  LATCH Score: 8   Interventions Interventions: Breast feeding basics reviewed;Assisted with latch;Breast compression;Education  Discharge Pump: Personal (Encouraged to start pumping)     Melinda Wells 03/03/2021, 5:01 PM

## 2021-05-21 ENCOUNTER — Other Ambulatory Visit: Payer: Self-pay

## 2021-07-18 IMAGING — US US OB COMP +14 WK
1 series · 13 of 28 positions shown · non-contrast
Comparison: none

CLINICAL DATA: Poor weight gain during pregnancy. Evaluate fetal
growth.

EXAM:
OBSTETRICAL ULTRASOUND >14 WKS

[Series 1: us ob comp + 14 wk · 13 of 67 slices shown]
[im 3/67]
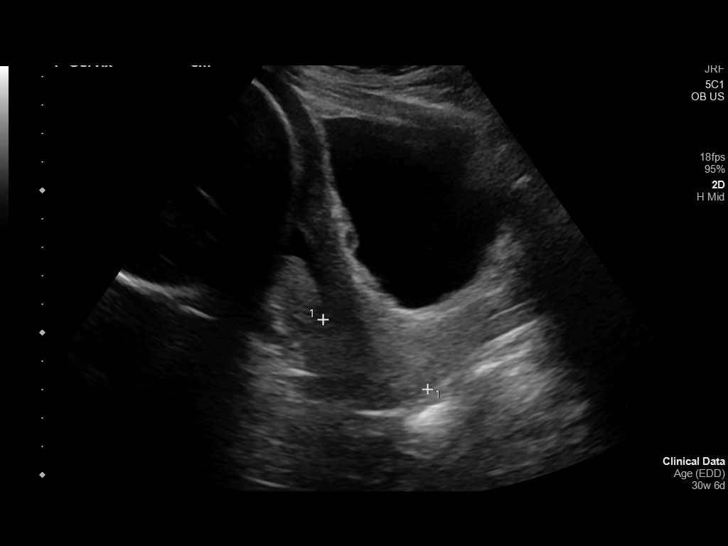
[im 8/67]
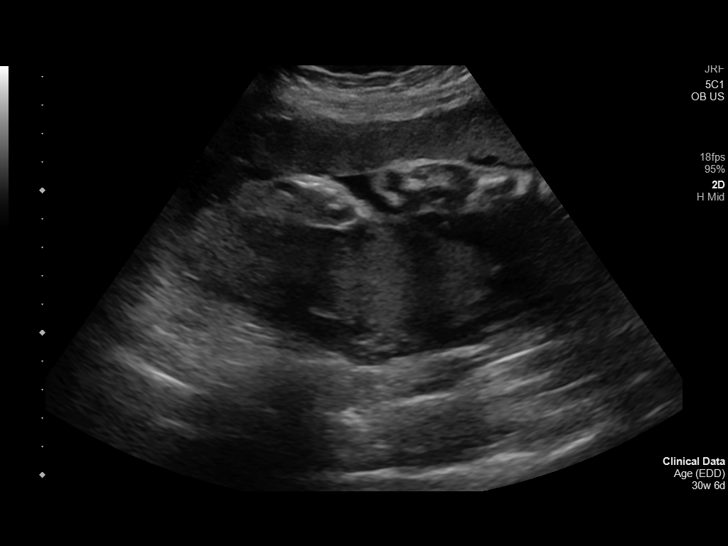
[im 13/67]
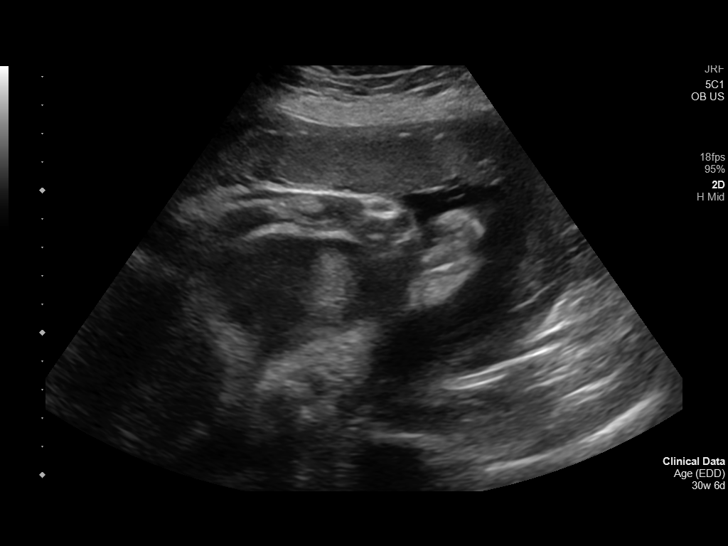
[im 18/67]
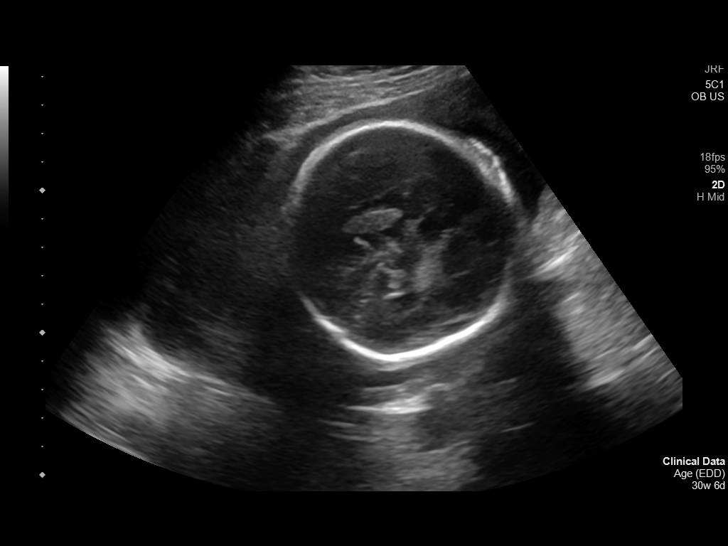
[im 23/67]
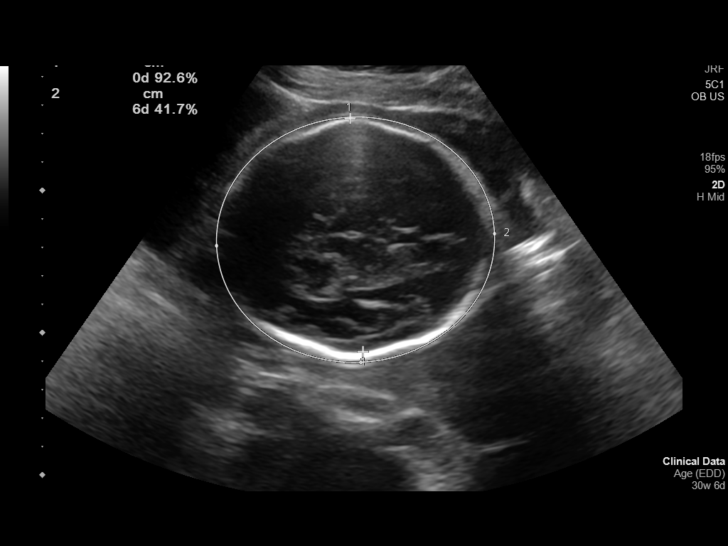
[im 27/67]
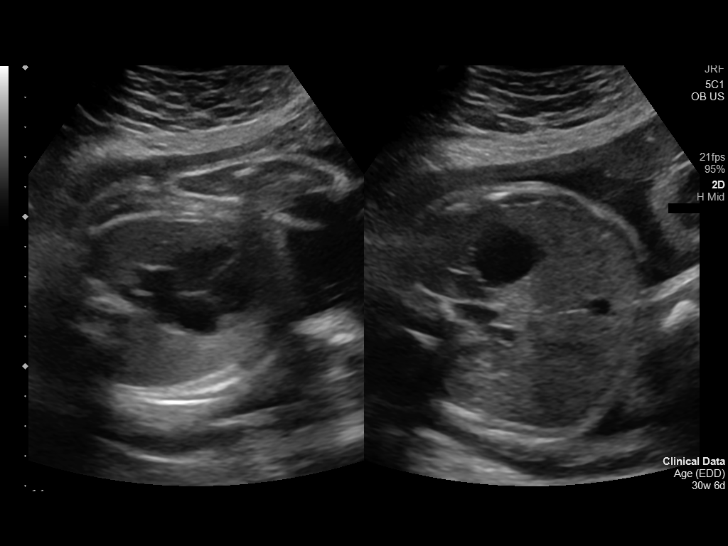
[im 35/67]
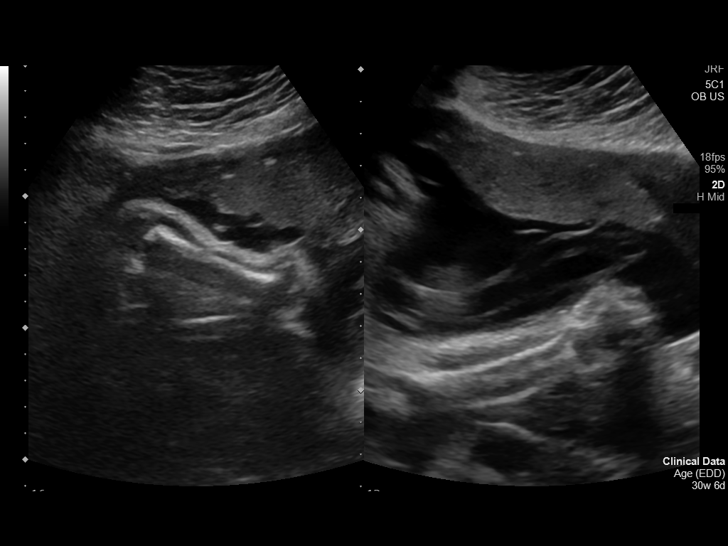
[im 40/67]
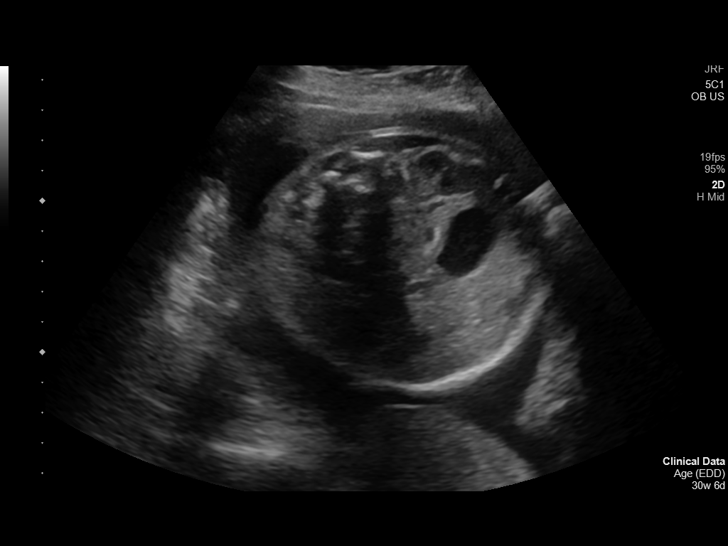
[im 45/67]
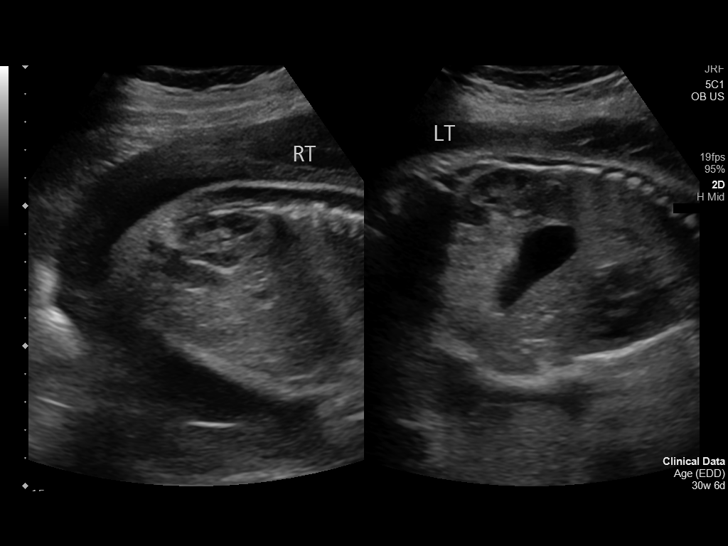
[im 49/67]
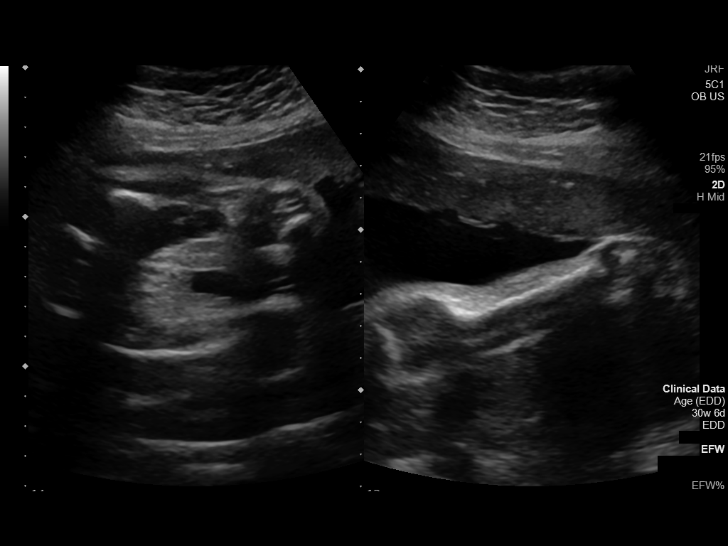
[im 54/67]
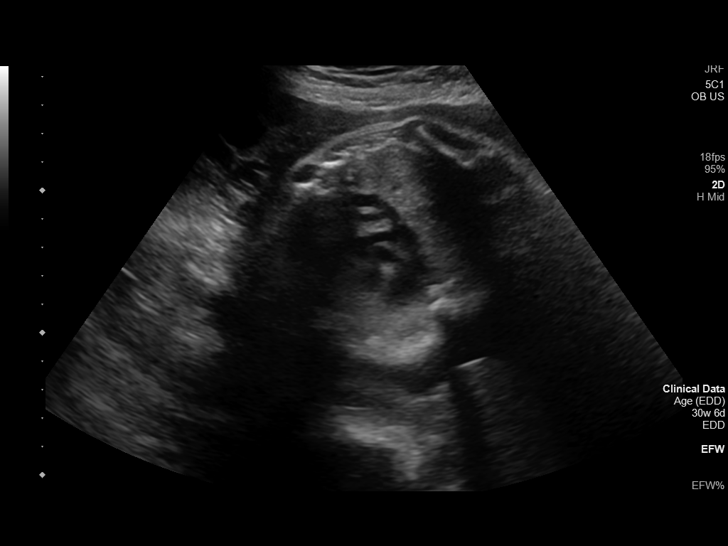
[im 59/67]
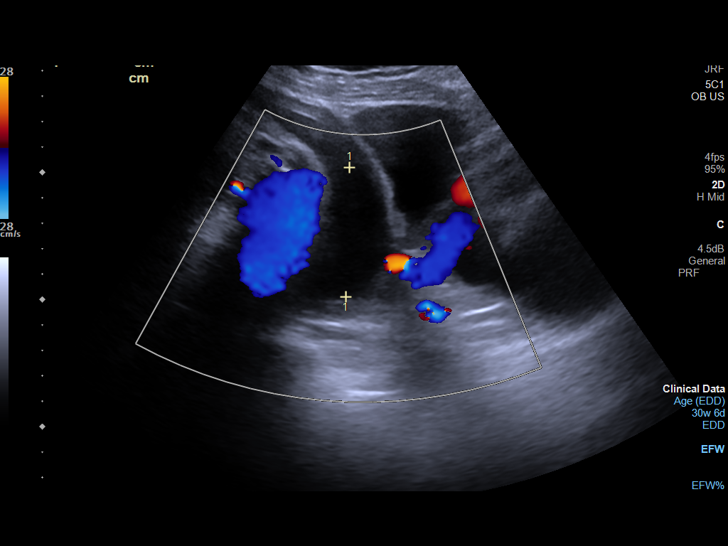
[im 64/67]
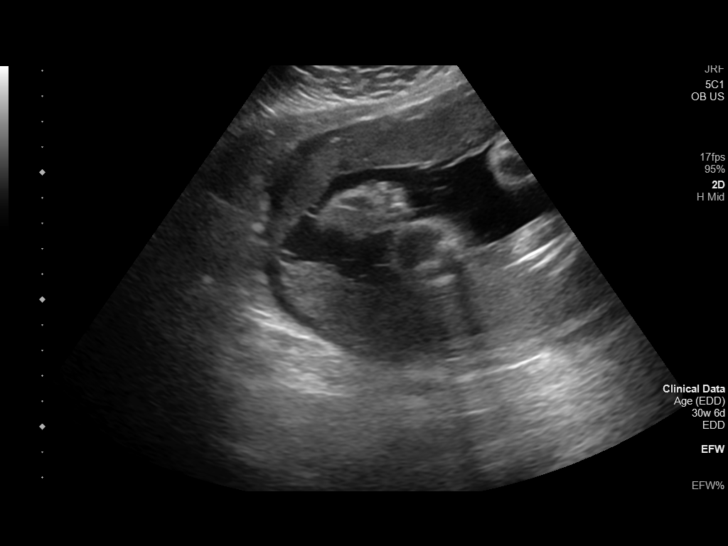

[13 of 28 positions shown; findings below may reference images not displayed]

FINDINGS: Number of Fetuses: 1

Heart Rate:  153 bpm

Movement: Yes

Presentation: Cephalic

Previa: No

Placental Location: Posterior, with possible succenturiate lobe
anteriorly

Amniotic Fluid (Subjective): Within normal limits

Amniotic Fluid (Objective):

Vertical pocket = cm

AFI = 13.1 cm (5%ile= 9.0 cm, 95%= 23.4 cm for 30 wks)

FETAL BIOMETRY

BPD: 8.2cm 33w 0d

HC:   28.6cm 31w 3d

AC:   27.1cm 31w 1d

FL:   5.8cm 30w 3d

Current Mean GA: 31w 0d US EDC: 12/12/2020

Assigned GA:  30w 6d Assigned EDC: 12/13/2020

Estimated Fetal Weight:  1,705g 47%ile

FETAL ANATOMY

Lateral Ventricles: Appears normal

Thalami/CSP: Appears normal

Posterior Fossa:  Not visualized

Nuchal Region: Not visualized   NFT= N/A > 20 WKS

Upper Lip: Appears normal

Spine: Appears normal

4 Chamber Heart on Left: Appears normal

LVOT: Not visualized

RVOT: Not visualized

Stomach on Left: Appears normal

3 Vessel Cord: Appears normal

Cord Insertion site: Appears normal

Kidneys: Appears normal

Bladder: Appears normal

Extremities: Appears normal

Sex: Male

Technically difficult due to: Advanced gestational age and fetal
position

Maternal Findings:

Cervix:  4.4 cm TA
IMPRESSION: Assigned GA currently 30 weeks 6 days. Appropriate fetal growth,
with EFW currently at 47 %ile.

Amniotic fluid volume within normal limits, with AFI of 13.1 cm.

## 2021-08-22 ENCOUNTER — Other Ambulatory Visit: Payer: Self-pay

## 2021-09-29 ENCOUNTER — Ambulatory Visit (INDEPENDENT_AMBULATORY_CARE_PROVIDER_SITE_OTHER): Payer: No Typology Code available for payment source | Admitting: Certified Nurse Midwife

## 2021-09-29 ENCOUNTER — Other Ambulatory Visit: Payer: Self-pay

## 2021-09-29 ENCOUNTER — Encounter: Payer: Self-pay | Admitting: Certified Nurse Midwife

## 2021-09-29 VITALS — BP 121/79 | HR 70 | Ht 62.0 in | Wt 156.4 lb

## 2021-09-29 DIAGNOSIS — Z01419 Encounter for gynecological examination (general) (routine) without abnormal findings: Secondary | ICD-10-CM

## 2021-09-29 DIAGNOSIS — Z113 Encounter for screening for infections with a predominantly sexual mode of transmission: Secondary | ICD-10-CM | POA: Diagnosis not present

## 2021-09-29 MED ORDER — NORETHINDRONE 0.35 MG PO TABS
1.0000 | ORAL_TABLET | Freq: Every day | ORAL | 11 refills | Status: DC
  Filled 2021-09-29: qty 28, 28d supply, fill #0
  Filled 2021-11-24: qty 84, 84d supply, fill #0

## 2021-09-29 NOTE — Progress Notes (Signed)
? ? ?GYNECOLOGY ANNUAL PREVENTATIVE CARE ENCOUNTER NOTE ? ?History:    ? Melinda Wells is a 36 y.o. G43P1021 female here for a routine annual gynecologic exam.  Current complaints: none.   Denies abnormal vaginal bleeding, discharge, pelvic pain, problems with intercourse or other gynecologic concerns.  ?  ? ?Social ?Relationship: married  ?Living:spouse and child  ?Work: Visual merchandiser  ?Exercise: none outside of work ?Smoke/Alcohol/drug use: denies  ? ?Gynecologic History ?No LMP recorded (lmp unknown). ?Contraception: oral progesterone-only contraceptive ?Last Pap: 09/22/2017. Results were: normal with negative HPV ?Last mammogram: n/a .  ? ? ?The pregnancy intention screening data noted above was reviewed. Potential methods of contraception were discussed. The patient elected to proceed with POP. ? ? ?Obstetric History ?OB History  ?Gravida Para Term Preterm AB Living  ?3 1 1   2 1   ?SAB IAB Ectopic Multiple Live Births  ?1     0 1  ?  ?# Outcome Date GA Lbr Len/2nd Weight Sex Delivery Anes PTL Lv  ?3 Term 12/15/20 [redacted]w[redacted]d / 01:57 7 lb 15 oz (3.6 kg) M Vag-Spont EPI  LIV  ?2 SAB 2021          ?1 AB 2013          ? ? ?Past Medical History:  ?Diagnosis Date  ? Degeneration of cervical intervertebral disc   ? PCOS (polycystic ovarian syndrome)   ? ? ?Past Surgical History:  ?Procedure Laterality Date  ? NO PAST SURGERIES    ? ? ?Current Outpatient Medications on File Prior to Visit  ?Medication Sig Dispense Refill  ? folic acid (FOLVITE) 400 MCG tablet     ? norethindrone (MICRONOR) 0.35 MG tablet Take 1 tablet (0.35 mg total) by mouth daily. 28 tablet 11  ? Prenatal Vit-Fe Fumarate-FA (MULTIVITAMIN-PRENATAL) 27-0.8 MG TABS tablet Take 1 tablet by mouth daily at 12 noon.    ? benzocaine-Menthol (DERMOPLAST) 20-0.5 % AERO Apply 1 application topically as needed for irritation (perineal discomfort). (Patient not taking: Reported on 01/27/2021) 56 g 1  ? CALCIUM-MAGNESIUM-ZINC PO  (Patient not taking: Reported on  09/29/2021)    ? docusate sodium (COLACE) 100 MG capsule  (Patient not taking: Reported on 01/27/2021)    ? ibuprofen (ADVIL) 600 MG tablet Take 1 tablet (600 mg total) by mouth every 6 (six) hours. (Patient not taking: Reported on 09/29/2021) 30 tablet 0  ? witch hazel-glycerin (TUCKS) pad Apply 1 application topically as needed for hemorrhoids. (Patient not taking: Reported on 09/29/2021) 40 each 12  ? ?No current facility-administered medications on file prior to visit.  ? ? ?No Known Allergies ? ?Social History:  reports that she has never smoked. She has never used smokeless tobacco. She reports that she does not currently use alcohol. She reports that she does not use drugs. ? ?Family History  ?Problem Relation Age of Onset  ? Hyperlipidemia Mother   ? Diabetes Maternal Grandfather   ? ? ?The following portions of the patient's history were reviewed and updated as appropriate: allergies, current medications, past family history, past medical history, past social history, past surgical history and problem list. ? ?Review of Systems ?Pertinent items noted in HPI and remainder of comprehensive ROS otherwise negative. ? ?Physical Exam:  ?BP 121/79   Pulse 70   Ht 5\' 2"  (1.575 m)   Wt 156 lb 6.4 oz (70.9 kg)   LMP  (LMP Unknown)   Breastfeeding Unknown   BMI 28.61 kg/m?  ?CONSTITUTIONAL: Well-developed, well-nourished  female in no acute distress.  ?HENT:  Normocephalic, atraumatic, External right and left ear normal. Oropharynx is clear and moist ?EYES: Conjunctivae and EOM are normal. Pupils are equal, round, and reactive to light. No scleral icterus.  ?NECK: Normal range of motion, supple, no masses.  Normal thyroid.  ?SKIN: Skin is warm and dry. No rash noted. Not diaphoretic. No erythema. No pallor. ?MUSCULOSKELETAL: Normal range of motion. No tenderness.  No cyanosis, clubbing, or edema.  2+ distal pulses. ?NEUROLOGIC: Alert and oriented to person, place, and time. Normal reflexes, muscle tone coordination.   ?PSYCHIATRIC: Normal mood and affect. Normal behavior. Normal judgment and thought content. ?CARDIOVASCULAR: Normal heart rate noted, regular rhythm ?RESPIRATORY: Clear to auscultation bilaterally. Effort and breath sounds normal, no problems with respiration noted. ?BREASTS: Symmetric in size. No masses, tenderness, skin changes, nipple drainage, or lymphadenopathy bilaterally.  Lactating ?ABDOMEN: Soft, no distention noted.  No tenderness, rebound or guarding.  ?PELVIC: Normal appearing external genitalia and urethral meatus; normal appearing vaginal mucosa and cervix.  No abnormal discharge noted.  Pap smear not due  Normal uterine size, no other palpable masses, no uterine or adnexal tenderness.  . ?  ?Assessment and Plan:  ?  1. Well woman exam with routine gynecological exam ?- Hepatitis C Antibody ? ?2. Screening examination for STD (sexually transmitted disease) ?- Hepatitis C Antibody ? ?Pap: not due until 2024 ?Mammogram : n/a ?Labs: HEP C ?Refills: POP ?Referral: none ?Routine preventative health maintenance measures emphasized. ?Please refer to After Visit Summary for other counseling recommendations.  ?   ? ?Doreene Burke, CNM ?[facility] ?[facility],  [facility]   ?

## 2021-09-30 LAB — HEPATITIS C ANTIBODY: Hep C Virus Ab: NONREACTIVE

## 2021-11-24 ENCOUNTER — Other Ambulatory Visit: Payer: Self-pay

## 2022-01-05 ENCOUNTER — Encounter: Payer: Self-pay | Admitting: Certified Nurse Midwife

## 2022-02-18 ENCOUNTER — Other Ambulatory Visit: Payer: Self-pay

## 2022-02-18 ENCOUNTER — Encounter: Payer: Self-pay | Admitting: Certified Nurse Midwife

## 2022-02-23 ENCOUNTER — Other Ambulatory Visit: Payer: Self-pay | Admitting: Certified Nurse Midwife

## 2022-02-23 ENCOUNTER — Other Ambulatory Visit: Payer: Self-pay

## 2022-02-23 MED ORDER — NORGESTIM-ETH ESTRAD TRIPHASIC 0.18/0.215/0.25 MG-25 MCG PO TABS
1.0000 | ORAL_TABLET | Freq: Every day | ORAL | 11 refills | Status: DC
  Filled 2022-02-23: qty 84, 84d supply, fill #0
  Filled 2022-04-29: qty 84, 84d supply, fill #1

## 2022-02-24 ENCOUNTER — Other Ambulatory Visit: Payer: Self-pay

## 2022-03-03 ENCOUNTER — Telehealth: Payer: No Typology Code available for payment source | Admitting: Certified Nurse Midwife

## 2022-04-29 ENCOUNTER — Other Ambulatory Visit: Payer: Self-pay

## 2022-05-04 ENCOUNTER — Other Ambulatory Visit: Payer: Self-pay

## 2022-06-24 ENCOUNTER — Other Ambulatory Visit: Payer: Self-pay

## 2022-07-30 ENCOUNTER — Encounter: Payer: Self-pay | Admitting: Certified Nurse Midwife

## 2022-08-03 ENCOUNTER — Other Ambulatory Visit: Payer: Self-pay | Admitting: Certified Nurse Midwife

## 2022-08-03 ENCOUNTER — Other Ambulatory Visit: Payer: Self-pay

## 2022-08-03 MED ORDER — MEDROXYPROGESTERONE ACETATE 150 MG/ML IM SUSY
150.0000 mg | PREFILLED_SYRINGE | Freq: Once | INTRAMUSCULAR | 3 refills | Status: DC
  Filled 2022-08-03: qty 1, 84d supply, fill #0
  Filled 2022-11-02: qty 1, 84d supply, fill #1

## 2022-08-10 ENCOUNTER — Ambulatory Visit (INDEPENDENT_AMBULATORY_CARE_PROVIDER_SITE_OTHER): Payer: 59

## 2022-08-10 VITALS — BP 102/70 | Ht 62.0 in | Wt 165.0 lb

## 2022-08-10 DIAGNOSIS — Z3202 Encounter for pregnancy test, result negative: Secondary | ICD-10-CM

## 2022-08-10 DIAGNOSIS — Z30013 Encounter for initial prescription of injectable contraceptive: Secondary | ICD-10-CM | POA: Diagnosis not present

## 2022-08-10 LAB — POCT URINE PREGNANCY: Preg Test, Ur: NEGATIVE

## 2022-08-10 MED ORDER — MEDROXYPROGESTERONE ACETATE 150 MG/ML IM SUSP
150.0000 mg | Freq: Once | INTRAMUSCULAR | Status: AC
  Administered 2022-08-10: 150 mg via INTRAMUSCULAR

## 2022-08-10 NOTE — Addendum Note (Signed)
Addended by: Drenda Freeze on: 08/10/2022 10:53 AM   Modules accepted: Orders

## 2022-08-10 NOTE — Progress Notes (Cosign Needed Addendum)
    NURSE VISIT NOTE  Subjective:    Patient ID: Melinda Wells, female    DOB: February 20, 1986, 37 y.o.   MRN: [id number]  HPI  Patient is a 37 y.o. G45P1021 female who presents for depo provera injection.   Objective:    BP 102/70   Ht 5\' 2"  (1.575 m)   Wt 165 lb (74.8 kg)   LMP 08/06/2022 (Exact Date)   Breastfeeding No   BMI 30.18 kg/m   Last Annual: 09/29/2021. Last pap: 09/22/2017. Last Depo-Provera: N/A. Side Effects if any: none. Serum HCG indicated? No . Depo-Provera 150 mg IM given by: Drenda Freeze, CMA. Site: Right Upper Outer Quandrant  Lab Review  UPT-Negative  Assessment:   1. Encounter for initial prescription of injectable contraceptive      Plan:   Next appointment due between April 9 and April 23.    Drenda Freeze, CMA

## 2022-11-02 ENCOUNTER — Ambulatory Visit (INDEPENDENT_AMBULATORY_CARE_PROVIDER_SITE_OTHER): Payer: 59

## 2022-11-02 ENCOUNTER — Other Ambulatory Visit: Payer: Self-pay

## 2022-11-02 VITALS — BP 109/70 | HR 72 | Wt 171.7 lb

## 2022-11-02 DIAGNOSIS — Z3042 Encounter for surveillance of injectable contraceptive: Secondary | ICD-10-CM

## 2022-11-02 MED ORDER — MEDROXYPROGESTERONE ACETATE 150 MG/ML IM SUSY
150.0000 mg | PREFILLED_SYRINGE | Freq: Once | INTRAMUSCULAR | Status: AC
  Administered 2022-11-02: 150 mg via INTRAMUSCULAR

## 2022-11-02 NOTE — Progress Notes (Signed)
    NURSE VISIT NOTE  Subjective:    Patient ID: Melinda Wells, female    DOB: July 16, 1986, 37 y.o.   MRN: [id number]  HPI  Patient is a 37 y.o. G7P1021 female who presents for depo provera injection.   Objective:    BP 109/70   Pulse 72   Wt 171 lb 11.2 oz (77.9 kg)   BMI 31.40 kg/m   Last Annual: 09/29/2021. Last pap: 3/6/20219. Last Depo-Provera: 08/10/2022. Side Effects if any: none. Serum HCG indicated? No . Depo-Provera 150 mg IM given by: Doristine Devoid, CMA. Site: Left Upper Outer Quandrant    Assessment:   1. Encounter for Depo-Provera contraception      Plan:   Next appointment due between July 1st and July 15th.    Burtis Junes, CMA

## 2023-01-22 ENCOUNTER — Other Ambulatory Visit (HOSPITAL_COMMUNITY)
Admission: RE | Admit: 2023-01-22 | Discharge: 2023-01-22 | Disposition: A | Discharge status: Home / Self Care | Payer: 59 | Source: Ambulatory Visit | Attending: Certified Nurse Midwife | Admitting: Certified Nurse Midwife

## 2023-01-22 ENCOUNTER — Other Ambulatory Visit: Payer: Self-pay

## 2023-01-22 ENCOUNTER — Encounter: Payer: Self-pay | Admitting: Certified Nurse Midwife

## 2023-01-22 ENCOUNTER — Ambulatory Visit (INDEPENDENT_AMBULATORY_CARE_PROVIDER_SITE_OTHER): Payer: 59 | Admitting: Certified Nurse Midwife

## 2023-01-22 VITALS — BP 110/74 | HR 91 | Resp 15 | Ht 62.0 in | Wt 173.2 lb

## 2023-01-22 DIAGNOSIS — E669 Obesity, unspecified: Secondary | ICD-10-CM | POA: Diagnosis not present

## 2023-01-22 DIAGNOSIS — Z01419 Encounter for gynecological examination (general) (routine) without abnormal findings: Secondary | ICD-10-CM | POA: Insufficient documentation

## 2023-01-22 DIAGNOSIS — Z124 Encounter for screening for malignant neoplasm of cervix: Secondary | ICD-10-CM | POA: Insufficient documentation

## 2023-01-22 MED ORDER — MEDROXYPROGESTERONE ACETATE 150 MG/ML IM SUSY
150.0000 mg | PREFILLED_SYRINGE | Freq: Once | INTRAMUSCULAR | 3 refills | Status: DC
  Filled 2023-01-22: qty 1, 84d supply, fill #0

## 2023-01-22 NOTE — Progress Notes (Signed)
GYNECOLOGY ANNUAL PREVENTATIVE CARE ENCOUNTER NOTE  History:     Melinda Wells is a 37 y.o. G7P1021 female here for a routine annual gynecologic exam.  Current complaints: none.   Denies abnormal vaginal bleeding, discharge, pelvic pain, problems with intercourse or other gynecologic concerns.     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 Patient has had an injection. Contraception: Depo-Provera injections Last Pap: 09/22/2017. Results were: normal with negative HPV Last mammogram: n/a.    Upstream - 01/22/23 0842       Pregnancy Intention Screening   Does the patient want to become pregnant in the next year? No    Does the patient's partner want to become pregnant in the next year? No    Would the patient like to discuss contraceptive options today? No      Contraception Wrap Up   Current Method Hormonal Injection    End Method Hormonal Injection    Contraception Counseling Provided No            The pregnancy intention screening data noted above was reviewed. Potential methods of contraception were discussed. The patient elected to proceed with Hormonal Injection.   Obstetric History OB History  Gravida Para Term Preterm AB Living  3 1 1   2 1   SAB IAB Ectopic Multiple Live Births  1     0 1    # Outcome Date GA Lbr Len/2nd Weight Sex Delivery Anes PTL Lv  3 Term 12/15/20 [redacted]w[redacted]d / 01:57 7 lb 15 oz (3.6 kg) M Vag-Spont EPI  LIV  2 SAB 2021          1 AB 2013            Past Medical History:  Diagnosis Date   Degeneration of cervical intervertebral disc    PCOS (polycystic ovarian syndrome)     Past Surgical History:  Procedure Laterality Date   NO PAST SURGERIES      No current outpatient medications on file prior to visit.   No current facility-administered medications on file prior to  visit.    No Known Allergies  Social History:  reports that she has never smoked. She has never used smokeless tobacco. She reports that she does not currently use alcohol. She reports that she does not use drugs.  Family History  Problem Relation Age of Onset   Hyperlipidemia Mother    Diabetes Maternal Grandfather     The following portions of the patient's history were reviewed and updated as appropriate: allergies, current medications, past family history, past medical history, past social history, past surgical history and problem list.  Review of Systems Pertinent items noted in HPI and remainder of comprehensive ROS otherwise negative.  Physical Exam:  BP 110/74   Pulse 91   Resp 15   Ht 5\' 2"  (1.575 m)   Wt 173 lb 3.2 oz (78.6 kg)   LMP  (LMP Unknown)   BMI 31.68 kg/m  CONSTITUTIONAL: Well-developed, well-nourished female in no acute distress.  HENT:  Normocephalic, atraumatic, External right and left ear normal. Oropharynx is clear and moist EYES: Conjunctivae and EOM are normal. Pupils are equal, round, and reactive to light. No scleral icterus.  NECK: Normal range of motion, supple, no masses.  Normal thyroid.  SKIN: Skin is warm and dry.  No rash noted. Not diaphoretic. No erythema. No pallor. MUSCULOSKELETAL: Normal range of motion. No tenderness.  No cyanosis, clubbing, or edema.  2+ distal pulses. NEUROLOGIC: Alert and oriented to person, place, and time. Normal reflexes, muscle tone coordination.  PSYCHIATRIC: Normal mood and affect. Normal behavior. Normal judgment and thought content. CARDIOVASCULAR: Normal heart rate noted, regular rhythm RESPIRATORY: Clear to auscultation bilaterally. Effort and breath sounds normal, no problems with respiration noted. BREASTS: Symmetric in size. No masses, tenderness, skin changes, nipple drainage, or lymphadenopathy bilaterally.  ABDOMEN: Soft, no distention noted.  No tenderness, rebound or guarding.  PELVIC: Normal  appearing external genitalia and urethral meatus; normal appearing vaginal mucosa and cervix.  No abnormal discharge noted.  Pap smear obtained.contact bleeding with pap.  Normal uterine size, no other palpable masses, no uterine or adnexal tenderness.  .   Assessment and Plan:    1. Women's annual routine gynecological examination   Pap: Will follow up results of pap smear and manage accordingly. Mammogram : n/a  Labs: lipid  Refills: depo  Referral: none  Routine preventative health maintenance measures emphasized. Please refer to After Visit Summary for other counseling recommendations.      Doreene Burke, CNM [facility]  [facility],  [facility]

## 2023-01-22 NOTE — Patient Instructions (Signed)

## 2023-01-23 LAB — LIPID PANEL
Chol/HDL Ratio: 3.1 ratio (ref 0.0–4.4)
Cholesterol, Total: 147 mg/dL (ref 100–199)
HDL: 47 mg/dL (ref >39)
LDL Chol Calc (NIH): 75 mg/dL (ref 0–99)
Triglycerides: 144 mg/dL (ref 0–149)
VLDL Cholesterol Cal: 25 mg/dL (ref 5–40)

## 2023-01-25 ENCOUNTER — Ambulatory Visit (INDEPENDENT_AMBULATORY_CARE_PROVIDER_SITE_OTHER): Payer: 59

## 2023-01-25 VITALS — BP 105/71 | HR 67 | Ht 62.0 in | Wt 169.0 lb

## 2023-01-25 DIAGNOSIS — Z3042 Encounter for surveillance of injectable contraceptive: Secondary | ICD-10-CM | POA: Diagnosis not present

## 2023-01-25 LAB — CYTOLOGY - PAP
Comment: NEGATIVE
Diagnosis: NEGATIVE
High risk HPV: NEGATIVE

## 2023-01-25 MED ORDER — MEDROXYPROGESTERONE ACETATE 150 MG/ML IM SUSY
150.0000 mg | PREFILLED_SYRINGE | Freq: Once | INTRAMUSCULAR | Status: AC
  Administered 2023-01-25: 150 mg via INTRAMUSCULAR

## 2023-01-25 NOTE — Progress Notes (Signed)
    NURSE VISIT NOTE  Subjective:    Patient ID: Melinda Wells, female    DOB: 1986-04-21, 37 y.o.   MRN: [id number]  HPI  Patient is a 37 y.o. G76P1021 female who presents for depo provera injection.   Objective:    Ht 5\' 2"  (1.575 m)   Wt 169 lb (76.7 kg)   LMP  (LMP Unknown)   BMI 30.91 kg/m   Last Annual: 01/22/23. Last pap: 01-22-23. Last Depo-Provera: 10/22/22. Side Effects if any: none. Serum HCG indicated? No . Depo-Provera 150 mg IM given by: Beverely Pace, CMA. Site: Right Ventrogluteal    Assessment:   1. Encounter for management and injection of depo-Provera      Plan:   Next appointment due between SEPT-23 and OCT-7.    Loney Laurence, CMA

## 2023-02-02 ENCOUNTER — Other Ambulatory Visit: Payer: Self-pay

## 2023-04-19 ENCOUNTER — Ambulatory Visit (INDEPENDENT_AMBULATORY_CARE_PROVIDER_SITE_OTHER): Payer: 59

## 2023-04-19 VITALS — BP 109/76 | HR 91 | Ht 62.0 in | Wt 175.9 lb

## 2023-04-19 DIAGNOSIS — Z3042 Encounter for surveillance of injectable contraceptive: Secondary | ICD-10-CM

## 2023-04-19 MED ORDER — MEDROXYPROGESTERONE ACETATE 150 MG/ML IM SUSP
150.0000 mg | Freq: Once | INTRAMUSCULAR | Status: AC
  Administered 2023-04-19: 150 mg via INTRAMUSCULAR

## 2023-04-19 NOTE — Progress Notes (Signed)
    NURSE VISIT NOTE  Subjective:    Patient ID: Melinda Wells, female    DOB: 1985-12-26, 37 y.o.   MRN: [id number]  HPI  Patient is a 37 y.o. G47P1021 female who presents for depo provera injection.   Objective:    BP 109/76   Pulse 91   Ht 5\' 2"  (1.575 m)   Wt 175 lb 14.4 oz (79.8 kg)   BMI 32.17 kg/m   Last Annual: 01/22/23. Last pap: 01/22/23. Last Depo-Provera: 01/25/23. Side Effects if any: none. Serum HCG indicated? No . Depo-Provera 150 mg IM given by: Rocco Serene, LPN. Site: Left Upper Outer Quandrant  Lab Review  @THIS  VISIT ONLY@  Assessment:   1. Surveillance for Depo-Provera contraception      Plan:   Next appointment due between 12/16 and 07/19/23.    Rocco Serene, LPN

## 2023-04-19 NOTE — Patient Instructions (Signed)

## 2023-07-09 NOTE — Progress Notes (Unsigned)
    NURSE VISIT NOTE  Subjective:    Patient ID: Melinda Wells, female    DOB: 10-Mar-1986, 37 y.o.   MRN: [id number]  HPI  Patient is a 37 y.o. G30P1021 female who presents for depo provera injection.   Objective:    There were no vitals taken for this visit.  Last Annual: 01/22/23. Last pap: 01/22/23. Last Depo-Provera: 04/19/23. Side Effects if any: {NONE:21772}***. Serum HCG indicated? {YES/NO:21197}. Depo-Provera 150 mg IM given by: {AOB Clinical UVOZD:66440}. Site: {AOB INJ [id number]  Lab Review  @THIS  VISIT ONLY@  Assessment:   No diagnosis found.   Plan:   Next appointment due between *** and ***.    Cornelius Moras, CMA

## 2023-07-09 NOTE — Patient Instructions (Signed)
Contraceptive Injection A contraceptive injection is a shot that prevents pregnancy. It is also called a birth control shot. The shot contains the hormone progestin, which prevents pregnancy by: Stopping the ovaries from releasing eggs. Thickening cervical mucus to prevent sperm from entering the cervix. Thinning the lining of the uterus to prevent a fertilized egg from attaching to the uterus. Contraceptive injections are given under the skin (subcutaneous) or into a muscle (intramuscular). For these shots to work, you must get one of them every 3 months (12-13 weeks) from a health care provider. Tell a health care provider about: Any allergies you have. All medicines you are taking, including vitamins, herbs, eye drops, creams, and over-the-counter medicines. Any blood disorders you have. Any medical conditions you have. Whether you are pregnant or may be pregnant. What are the risks? Generally, this is a safe procedure. However, problems may occur, including: Mood changes or depression. Loss of bone density (osteoporosis) after long-term use. Blood clots. This is rare. Higher risk of an egg being fertilized outside your uterus (ectopic pregnancy).This is rare. What happens before the procedure? Your health care provider may do a routine physical exam. You may have a test to make sure you are not pregnant. What happens during the procedure?  The area where the shot will be given will be cleaned and sanitized with alcohol. A needle will be inserted into a muscle in your upper arm or buttock, or into the skin of your thigh or abdomen. The needle will be attached to a syringe with the medicine inside of it. The medicine will be pushed through the syringe and injected into your body. A small bandage (dressing) may be placed over the injection site. What can I expect after the procedure? After the procedure, it is common to have: Soreness around the injection site for a couple of  days. Irregular menstrual bleeding. Weight gain. Breast tenderness. Headaches. Discomfort in your abdomen. Ask your health care provider whether you need to use an added method of birth control (backup contraception), such as a condom, sponge, or spermicide. If the first shot is given 1-7 days after the start of your last menstrual period, you will not need backup contraception. If the first shot is given at any other time during your menstrual cycle, you should avoid having sex. If you do have sex, you will need to use backup contraception for 7 days after you receive the shot. Follow these instructions at home: General instructions Take over-the-counter and prescription medicines only as told by your health care provider. Do not rub or massage the injection site. Track your menstrual periods so you will know if they become irregular. Always use a condom to protect against sexually transmitted infections (STIs). Make sure you schedule an appointment in time for your next shot and mark it on your calendar. You must get an injection every 3 months (12-13 weeks) to prevent pregnancy. Lifestyle Do not use any products that contain nicotine or tobacco. These products include cigarettes, chewing tobacco, and vaping devices, such as e-cigarettes. If you need help quitting, ask your health care provider. Eat foods that are high in calcium and vitamin D, such as milk, cheese, and salmon. Doing this may help with any loss in bone density caused by the contraceptive injection. Ask your health care provider for dietary recommendations. Contact a health care provider if you: Have nausea or vomiting. Have abnormal vaginal discharge or bleeding. Miss a menstrual period or think you might be pregnant. Experience mood changes   or depression. Feel dizzy or light-headed. Have leg pain. Get help right away if you: Have chest pain or cough up blood. Have shortness of breath. Have a severe headache that does  not go away. Have numbness in any part of your body. Have slurred speech or vision problems. Have vaginal bleeding that is abnormally heavy or does not stop, or you have severe pain in your abdomen. Have depression that does not get better with treatment. If you ever feel like you may hurt yourself or others, or have thoughts about taking your own life, get help right away. Go to your nearest emergency department or: Call your local emergency services (911 in the [place] Call a suicide crisis helpline, such as the National Suicide Prevention Lifeline at 1-800-273-8255 or 988 in the [place] This is open 24 hours a day in the [place] Text the Crisis Text Line at 741741 (in the [place] Summary A contraceptive injection is a shot that prevents pregnancy. It is also called the birth control shot. The shot is given under the skin (subcutaneous) or into a muscle (intramuscular). After this procedure, it is common to have soreness around the injection site for a couple of days. To prevent pregnancy, the shot must be given by a health care provider every 3 months (12-13 weeks). After you have the shot, ask your health care provider whether you need to use an added method of birth control (backup contraception), such as a condom, sponge, or spermicide. This information is not intended to replace advice given to you by your health care provider. Make sure you discuss any questions you have with your health care provider. Document Revised: 01/29/2021 Document Reviewed: 01/15/2020 Elsevier Patient Education  2024 [employer]  

## 2023-07-12 ENCOUNTER — Ambulatory Visit (INDEPENDENT_AMBULATORY_CARE_PROVIDER_SITE_OTHER): Payer: 59

## 2023-07-12 VITALS — BP 129/68 | HR 79 | Ht 62.0 in | Wt 178.0 lb

## 2023-07-12 DIAGNOSIS — Z3042 Encounter for surveillance of injectable contraceptive: Secondary | ICD-10-CM

## 2023-07-12 MED ORDER — MEDROXYPROGESTERONE ACETATE 150 MG/ML IM SUSP
150.0000 mg | Freq: Once | INTRAMUSCULAR | Status: AC
  Administered 2023-07-12: 150 mg via INTRAMUSCULAR

## 2023-08-12 DIAGNOSIS — D210 Benign neoplasm of connective and other soft tissue of head, face and neck: Secondary | ICD-10-CM | POA: Diagnosis not present

## 2023-10-04 ENCOUNTER — Ambulatory Visit (INDEPENDENT_AMBULATORY_CARE_PROVIDER_SITE_OTHER): Payer: 59

## 2023-10-04 VITALS — BP 99/61 | HR 76 | Wt 179.2 lb

## 2023-10-04 DIAGNOSIS — Z3042 Encounter for surveillance of injectable contraceptive: Secondary | ICD-10-CM

## 2023-10-04 MED ORDER — MEDROXYPROGESTERONE ACETATE 150 MG/ML IM SUSY
150.0000 mg | PREFILLED_SYRINGE | Freq: Once | INTRAMUSCULAR | Status: AC
  Administered 2023-10-04: 150 mg via INTRAMUSCULAR

## 2023-10-04 NOTE — Progress Notes (Addendum)
    NURSE VISIT NOTE  Subjective:    Patient ID: Melinda Wells, female    DOB: 29-Dec-1985, 38 y.o.   MRN: [id number]  HPI  Patient is a 38 y.o. G66P1021 female who presents for depo provera injection.   Objective:    BP 99/61 (BP Location: Left Arm, Patient Position: Sitting, Cuff Size: Large)   Pulse 76   Wt 179 lb 3.2 oz (81.3 kg)   BMI 32.78 kg/m   Last Annual: 01/22/2023. Last pap: 01/22/2023. Last Depo-Provera: 07/12/2023. Side Effects if any: none. Serum HCG indicated? No . Depo-Provera 150 mg IM given by: Doristine Devoid, CMA. Site: Left Upper Outer Quandrant  Lab Review  @THIS  VISIT ONLY@  Assessment:   1. Surveillance for Depo-Provera contraception      Plan:   Next appointment due between June 2nd  and June 6th.    Burtis Junes, CMA

## 2023-11-18 DIAGNOSIS — D210 Benign neoplasm of connective and other soft tissue of head, face and neck: Secondary | ICD-10-CM | POA: Diagnosis not present

## 2023-12-27 ENCOUNTER — Ambulatory Visit (INDEPENDENT_AMBULATORY_CARE_PROVIDER_SITE_OTHER)

## 2023-12-27 VITALS — BP 106/72 | HR 67 | Ht 62.0 in | Wt 181.6 lb

## 2023-12-27 DIAGNOSIS — Z3042 Encounter for surveillance of injectable contraceptive: Secondary | ICD-10-CM | POA: Diagnosis not present

## 2023-12-27 MED ORDER — MEDROXYPROGESTERONE ACETATE 150 MG/ML IM SUSP
150.0000 mg | Freq: Once | INTRAMUSCULAR | Status: AC
  Administered 2023-12-27: 150 mg via INTRAMUSCULAR

## 2023-12-27 NOTE — Progress Notes (Signed)
    NURSE VISIT NOTE  Subjective:    Patient ID: Melinda Wells, female    DOB: May 28, 1986, 38 y.o.   MRN: [id number]  HPI  Patient is a 38 y.o. G47P1021 female who presents for depo provera  injection.   Objective:    BP 106/72   Pulse 67   Ht 5\' 2"  (1.575 m)   Wt 181 lb 9.6 oz (82.4 kg)   BMI 33.22 kg/m   Last Annual: 01/22/23. Last pap: 01/22/23. Last Depo-Provera : 10/04/23. Side Effects if any: n/a. Serum HCG indicated? No . Depo-Provera  150 mg IM given by: Woody Heading, CMA. Site: Left Upper Outer Quandrant   Assessment:   1. Encounter for Depo-Provera  contraception      Plan:   Next appointment due between 03/13/24 and 03/27/24.    Vale Garrison, CMA

## 2024-02-14 ENCOUNTER — Other Ambulatory Visit: Payer: Self-pay

## 2024-02-14 ENCOUNTER — Encounter: Payer: Self-pay | Admitting: Certified Nurse Midwife

## 2024-02-14 ENCOUNTER — Ambulatory Visit: Admitting: Certified Nurse Midwife

## 2024-02-14 VITALS — BP 106/73 | HR 71 | Ht 62.0 in | Wt 179.6 lb

## 2024-02-14 DIAGNOSIS — Z01419 Encounter for gynecological examination (general) (routine) without abnormal findings: Secondary | ICD-10-CM

## 2024-02-14 MED ORDER — MEDROXYPROGESTERONE ACETATE 150 MG/ML IM SUSY
150.0000 mg | PREFILLED_SYRINGE | Freq: Once | INTRAMUSCULAR | 3 refills | Status: AC
  Filled 2024-02-14: qty 1, 84d supply, fill #0

## 2024-02-14 NOTE — Patient Instructions (Signed)

## 2024-02-14 NOTE — Progress Notes (Signed)
 GYNECOLOGY ANNUAL PREVENTATIVE CARE ENCOUNTER NOTE  History:     Melinda Wells is a 38 y.o. G15P1021 female here for a routine annual gynecologic exam.  Current complaints: None.   Denies abnormal vaginal bleeding, discharge, pelvic pain, problems with intercourse or other gynecologic concerns.     Social Relationship: Married  Living: Husband and Children Work: Associate Professor Exercise: Light Walking Smoke/Alcohol/drug use: None  Gynecologic History No LMP recorded. Patient has had an injection. Contraception: Depo-Provera  injections Last Pap: 01/22/2023. Results were: normal with negative HPV Last mammogram: n/a Pt mother recently diagnosed with Breast cancer. Discussed early mammogram screening if she desires. She will let me know.   Obstetric History OB History  Gravida Para Term Preterm AB Living  3 1 1  2 1   SAB IAB Ectopic Multiple Live Births  1   0 1    # Outcome Date GA Lbr Len/2nd Weight Sex Type Anes PTL Lv  3 Term 12/15/20 [redacted]w[redacted]d / 01:57 7 lb 15 oz (3.6 kg) M Vag-Spont EPI  LIV  2 SAB 2021          1 AB 2013            Past Medical History:  Diagnosis Date   Degeneration of cervical intervertebral disc    PCOS (polycystic ovarian syndrome)     Past Surgical History:  Procedure Laterality Date   NO PAST SURGERIES      Current Outpatient Medications on File Prior to Visit  Medication Sig Dispense Refill   medroxyPROGESTERone  Acetate 150 MG/ML SUSY Inject 1 mL (150 mg total) into the muscle once for 1 dose. 1 mL 3   No current facility-administered medications on file prior to visit.    No Known Allergies  Social History:  reports that she has never smoked. She has never used smokeless tobacco. She reports that she does not currently use alcohol. She reports that she does not use drugs.  Family History  Problem Relation Age of Onset   Hyperlipidemia Mother    Breast cancer Mother    Diabetes Maternal Grandfather     The following portions  of the patient's history were reviewed and updated as appropriate: allergies, current medications, past family history, past medical history, past social history, past surgical history and problem list.  Review of Systems Pertinent items noted in HPI and remainder of comprehensive ROS otherwise negative.  Physical Exam:  Ht 5' 2 (1.575 m)   Wt 179 lb 9.6 oz (81.5 kg)   BMI 32.85 kg/m  CONSTITUTIONAL: Well-developed, well-nourished, over weight female in no acute distress.  HENT:  Normocephalic, atraumatic, External right and left ear normal. Oropharynx is clear and moist EYES: Conjunctivae and EOM are normal. Pupils are equal, round, and reactive to light. No scleral icterus.  NECK: Normal range of motion, supple, no masses.  Normal thyroid.  SKIN: Skin is warm and dry. No rash noted. Not diaphoretic. No erythema. No pallor. MUSCULOSKELETAL: Normal range of motion. No tenderness.  No cyanosis, clubbing, or edema.  2+ distal pulses. NEUROLOGIC: Alert and oriented to person, place, and time. Normal reflexes, muscle tone coordination.  PSYCHIATRIC: Normal mood and affect. Normal behavior. Normal judgment and thought content. CARDIOVASCULAR: Normal heart rate noted, regular rhythm RESPIRATORY: Clear to auscultation bilaterally. Effort and breath sounds normal, no problems with respiration noted. BREASTS: Symmetric in size. No masses, tenderness, skin changes, nipple drainage, or lymphadenopathy bilaterally.  ABDOMEN: Soft, no distention noted.  No  tenderness, rebound or guarding.  PELVIC: Normal appearing external genitalia and urethral meatus; normal appearing vaginal mucosa and cervix.  No abnormal discharge noted.  Pap smear not due.  Normal uterine size, no other palpable masses, no uterine or adnexal tenderness.  .   Assessment and Plan:  Annual [facility] Women GYN Exam   Pap: not due  Mammogram : pt will notify me if she desires early screening Labs: non due  Refills: depo  Referral:  none  Routine preventative health maintenance measures emphasized. Please refer to After Visit Summary for other counseling recommendations.      Zelda Hummer, CNM [facility]  [facility],  [facility]

## 2024-02-24 ENCOUNTER — Other Ambulatory Visit: Payer: Self-pay

## 2024-03-27 ENCOUNTER — Ambulatory Visit (INDEPENDENT_AMBULATORY_CARE_PROVIDER_SITE_OTHER)

## 2024-03-27 VITALS — BP 104/71 | HR 69 | Ht 62.0 in | Wt 182.5 lb

## 2024-03-27 DIAGNOSIS — Z3042 Encounter for surveillance of injectable contraceptive: Secondary | ICD-10-CM | POA: Diagnosis not present

## 2024-03-27 MED ORDER — MEDROXYPROGESTERONE ACETATE 150 MG/ML IM SUSP
150.0000 mg | Freq: Once | INTRAMUSCULAR | Status: AC
Start: 2024-03-27 — End: 2024-03-27
  Administered 2024-03-27: 150 mg via INTRAMUSCULAR

## 2024-03-27 NOTE — Progress Notes (Signed)
    NURSE VISIT NOTE  Subjective:    Patient ID: Melinda Wells, female    DOB: 05/17/1986, 38 y.o.   MRN: [id number]  HPI  Patient is a 38 y.o. G44P1021 female who presents for depo provera  injection.   Objective:    BP 104/71   Pulse 69   Ht 5' 2 (1.575 m)   Wt 182 lb 8 oz (82.8 kg)   BMI 33.38 kg/m   Last Annual: 02/14/24. Last pap: 01/22/23. Last Depo-Provera : 12/27/23. Side Effects if any: n/a. Serum HCG indicated? No . Depo-Provera  150 mg IM given by: Waddell Maxim, CMA. Site: Right Deltoid   Assessment:   1. Encounter for Depo-Provera  contraception      Plan:   Next appointment due between 06/12/24 and 06/26/24.    Waddell JONELLE Maxim, CMA

## 2024-03-27 NOTE — Patient Instructions (Signed)

## 2024-06-19 ENCOUNTER — Ambulatory Visit

## 2024-09-18 ENCOUNTER — Ambulatory Visit: Admitting: Nurse Practitioner
